# Patient Record
Sex: Female | Born: 1975
Health system: Southern US, Community
[De-identification: ages and names within clinical notes are randomized; demographics above are authoritative.]

## PROBLEM LIST (undated history)

## (undated) DIAGNOSIS — F329 Major depressive disorder, single episode, unspecified: Secondary | ICD-10-CM

## (undated) DIAGNOSIS — E669 Obesity, unspecified: Secondary | ICD-10-CM

## (undated) DIAGNOSIS — F32A Depression, unspecified: Secondary | ICD-10-CM

## (undated) HISTORY — DX: Depression, unspecified: F32.A

## (undated) HISTORY — PX: NASAL SINUS SURGERY: SHX719

## (undated) HISTORY — DX: Major depressive disorder, single episode, unspecified: F32.9

## (undated) HISTORY — DX: Obesity, unspecified: E66.9

---

## 2007-05-10 ENCOUNTER — Ambulatory Visit: Payer: Self-pay | Admitting: Family Medicine

## 2009-12-05 ENCOUNTER — Ambulatory Visit: Payer: Self-pay | Admitting: Otolaryngology

## 2014-01-07 ENCOUNTER — Ambulatory Visit: Payer: Self-pay | Admitting: Obstetrics and Gynecology

## 2014-01-12 ENCOUNTER — Ambulatory Visit: Payer: Self-pay | Admitting: Obstetrics and Gynecology

## 2014-07-30 ENCOUNTER — Ambulatory Visit: Payer: Self-pay | Admitting: Obstetrics and Gynecology

## 2014-09-27 DIAGNOSIS — F32A Depression, unspecified: Secondary | ICD-10-CM | POA: Insufficient documentation

## 2014-09-27 DIAGNOSIS — F329 Major depressive disorder, single episode, unspecified: Secondary | ICD-10-CM

## 2014-09-27 DIAGNOSIS — E669 Obesity, unspecified: Secondary | ICD-10-CM

## 2014-11-12 ENCOUNTER — Ambulatory Visit (INDEPENDENT_AMBULATORY_CARE_PROVIDER_SITE_OTHER): Payer: 59 | Admitting: Obstetrics and Gynecology

## 2014-11-12 ENCOUNTER — Telehealth: Payer: Self-pay | Admitting: Obstetrics and Gynecology

## 2014-11-12 ENCOUNTER — Encounter: Payer: Self-pay | Admitting: Obstetrics and Gynecology

## 2014-11-12 VITALS — BP 111/80 | HR 94 | Ht 66.0 in | Wt 278.7 lb

## 2014-11-12 DIAGNOSIS — F418 Other specified anxiety disorders: Secondary | ICD-10-CM | POA: Diagnosis not present

## 2014-11-12 DIAGNOSIS — Z01419 Encounter for gynecological examination (general) (routine) without abnormal findings: Secondary | ICD-10-CM

## 2014-11-12 DIAGNOSIS — G479 Sleep disorder, unspecified: Secondary | ICD-10-CM | POA: Diagnosis not present

## 2014-11-12 MED ORDER — ALPRAZOLAM 0.25 MG PO TABS
0.5000 mg | ORAL_TABLET | Freq: Every evening | ORAL | Status: DC | PRN
Start: 2014-11-12 — End: 2014-11-16

## 2014-11-12 MED ORDER — ZOLPIDEM TARTRATE ER 12.5 MG PO TBCR: 12.5000 mg | EXTENDED_RELEASE_TABLET | Freq: Every evening | ORAL | Status: DC | PRN

## 2014-11-12 NOTE — Telephone Encounter (Signed)
PT WOULD LIKE A CALL BACK, THEY WRONG RX WAS CALLED IN.

## 2014-11-12 NOTE — Telephone Encounter (Signed)
Pt called and stated [facility] was going to write her Xanax for 0.5mg  due to cost Ok to send in on tuesday

## 2014-11-12 NOTE — Patient Instructions (Signed)
Preventive Care for Adults A healthy lifestyle and preventive care can promote health and wellness. Preventive health guidelines for women include the following key practices.  A routine yearly physical is a good way to check with your health care provider about your health and preventive screening. It is a chance to share any concerns and updates on your health and to receive a thorough exam.  Visit your dentist for a routine exam and preventive care every 6 months. Brush your teeth twice a day and floss once a day. Good oral hygiene prevents tooth decay and gum disease.  The frequency of eye exams is based on your age, health, family medical history, use of contact lenses, and other factors. Follow your health care provider's recommendations for frequency of eye exams.  Eat a healthy diet. Foods like vegetables, fruits, whole grains, low-fat dairy products, and lean protein foods contain the nutrients you need without too many calories. Decrease your intake of foods high in solid fats, added sugars, and salt. Eat the right amount of calories for you.Get information about a proper diet from your health care provider, if necessary.  Regular physical exercise is one of the most important things you can do for your health. Most adults should get at least 150 minutes of moderate-intensity exercise (any activity that increases your heart rate and causes you to sweat) each week. In addition, most adults need muscle-strengthening exercises on 2 or more days a week.  Maintain a healthy weight. The body mass index (BMI) is a screening tool to identify possible weight problems. It provides an estimate of body fat based on height and weight. Your health care provider can find your BMI and can help you achieve or maintain a healthy weight.For adults 20 years and older:  A BMI below 18.5 is considered underweight.  A BMI of 18.5 to 24.9 is normal.  A BMI of 25 to 29.9 is considered overweight.  A BMI of  30 and above is considered obese.  Maintain normal blood lipids and cholesterol levels by exercising and minimizing your intake of saturated fat. Eat a balanced diet with plenty of fruit and vegetables. Blood tests for lipids and cholesterol should begin at age 20 and be repeated every 5 years. If your lipid or cholesterol levels are high, you are over 50, or you are at high risk for heart disease, you may need your cholesterol levels checked more frequently.Ongoing high lipid and cholesterol levels should be treated with medicines if diet and exercise are not working.  If you smoke, find out from your health care provider how to quit. If you do not use tobacco, do not start.  Lung cancer screening is recommended for adults aged 55-80 years who are at high risk for developing lung cancer because of a history of smoking. A yearly low-dose CT scan of the lungs is recommended for people who have at least a 30-pack-year history of smoking and are a current smoker or have quit within the past 15 years. A pack year of smoking is smoking an average of 1 pack of cigarettes a day for 1 year (for example: 1 pack a day for 30 years or 2 packs a day for 15 years). Yearly screening should continue until the smoker has stopped smoking for at least 15 years. Yearly screening should be stopped for people who develop a health problem that would prevent them from having lung cancer treatment.  If you are pregnant, do not drink alcohol. If you are breastfeeding,   be very cautious about drinking alcohol. If you are not pregnant and choose to drink alcohol, do not have more than 1 drink per day. One drink is considered to be 12 ounces (355 mL) of beer, 5 ounces (148 mL) of wine, or 1.5 ounces (44 mL) of liquor.  Avoid use of street drugs. Do not share needles with anyone. Ask for help if you need support or instructions about stopping the use of drugs.  High blood pressure causes heart disease and increases the risk of  stroke. Your blood pressure should be checked at least every 1 to 2 years. Ongoing high blood pressure should be treated with medicines if weight loss and exercise do not work.  If you are 75-52 years old, ask your health care provider if you should take aspirin to prevent strokes.  Diabetes screening involves taking a blood sample to check your fasting blood sugar level. This should be done once every 3 years, after age 15, if you are within normal weight and without risk factors for diabetes. Testing should be considered at a younger age or be carried out more frequently if you are overweight and have at least 1 risk factor for diabetes.  Breast cancer screening is essential preventive care for women. You should practice "breast self-awareness." This means understanding the normal appearance and feel of your breasts and may include breast self-examination. Any changes detected, no matter how small, should be reported to a health care provider. Women in their 58s and 30s should have a clinical breast exam (CBE) by a health care provider as part of a regular health exam every 1 to 3 years. After age 16, women should have a CBE every year. Starting at age 53, women should consider having a mammogram (breast X-ray test) every year. Women who have a family history of breast cancer should talk to their health care provider about genetic screening. Women at a high risk of breast cancer should talk to their health care providers about having an MRI and a mammogram every year.  Breast cancer gene (BRCA)-related cancer risk assessment is recommended for women who have family members with BRCA-related cancers. BRCA-related cancers include breast, ovarian, tubal, and peritoneal cancers. Having family members with these cancers may be associated with an increased risk for harmful changes (mutations) in the breast cancer genes BRCA1 and BRCA2. Results of the assessment will determine the need for genetic counseling and  BRCA1 and BRCA2 testing.  Routine pelvic exams to screen for cancer are no longer recommended for nonpregnant women who are considered low risk for cancer of the pelvic organs (ovaries, uterus, and vagina) and who do not have symptoms. Ask your health care provider if a screening pelvic exam is right for you.  If you have had past treatment for cervical cancer or a condition that could lead to cancer, you need Pap tests and screening for cancer for at least 20 years after your treatment. If Pap tests have been discontinued, your risk factors (such as having a new sexual partner) need to be reassessed to determine if screening should be resumed. Some women have medical problems that increase the chance of getting cervical cancer. In these cases, your health care provider may recommend more frequent screening and Pap tests.  The HPV test is an additional test that may be used for cervical cancer screening. The HPV test looks for the virus that can cause the cell changes on the cervix. The cells collected during the Pap test can be  tested for HPV. The HPV test could be used to screen women aged 30 years and older, and should be used in women of any age who have unclear Pap test results. After the age of 30, women should have HPV testing at the same frequency as a Pap test.  Colorectal cancer can be detected and often prevented. Most routine colorectal cancer screening begins at the age of 50 years and continues through age 75 years. However, your health care provider may recommend screening at an earlier age if you have risk factors for colon cancer. On a yearly basis, your health care provider may provide home test kits to check for hidden blood in the stool. Use of a small camera at the end of a tube, to directly examine the colon (sigmoidoscopy or colonoscopy), can detect the earliest forms of colorectal cancer. Talk to your health care provider about this at age 50, when routine screening begins. Direct  exam of the colon should be repeated every 5-10 years through age 75 years, unless early forms of pre-cancerous polyps or small growths are found.  People who are at an increased risk for hepatitis B should be screened for this virus. You are considered at high risk for hepatitis B if:  You were born in a country where hepatitis B occurs often. Talk with your health care provider about which countries are considered high risk.  Your parents were born in a high-risk country and you have not received a shot to protect against hepatitis B (hepatitis B vaccine).  You have HIV or AIDS.  You use needles to inject street drugs.  You live with, or have sex with, someone who has hepatitis B.  You get hemodialysis treatment.  You take certain medicines for conditions like cancer, organ transplantation, and autoimmune conditions.  Hepatitis C blood testing is recommended for all people born from 1945 through 1965 and any individual with known risks for hepatitis C.  Practice safe sex. Use condoms and avoid high-risk sexual practices to reduce the spread of sexually transmitted infections (STIs). STIs include gonorrhea, chlamydia, syphilis, trichomonas, herpes, HPV, and human immunodeficiency virus (HIV). Herpes, HIV, and HPV are viral illnesses that have no cure. They can result in disability, cancer, and death.  You should be screened for sexually transmitted illnesses (STIs) including gonorrhea and chlamydia if:  You are sexually active and are younger than 24 years.  You are older than 24 years and your health care provider tells you that you are at risk for this type of infection.  Your sexual activity has changed since you were last screened and you are at an increased risk for chlamydia or gonorrhea. Ask your health care provider if you are at risk.  If you are at risk of being infected with HIV, it is recommended that you take a prescription medicine daily to prevent HIV infection. This is  called preexposure prophylaxis (PrEP). You are considered at risk if:  You are a heterosexual woman, are sexually active, and are at increased risk for HIV infection.  You take drugs by injection.  You are sexually active with a partner who has HIV.  Talk with your health care provider about whether you are at high risk of being infected with HIV. If you choose to begin PrEP, you should first be tested for HIV. You should then be tested every 3 months for as long as you are taking PrEP.  Osteoporosis is a disease in which the bones lose minerals and strength   with aging. This can result in serious bone fractures or breaks. The risk of osteoporosis can be identified using a bone density scan. Women ages 65 years and over and women at risk for fractures or osteoporosis should discuss screening with their health care providers. Ask your health care provider whether you should take a calcium supplement or vitamin D to reduce the rate of osteoporosis.  Menopause can be associated with physical symptoms and risks. Hormone replacement therapy is available to decrease symptoms and risks. You should talk to your health care provider about whether hormone replacement therapy is right for you.  Use sunscreen. Apply sunscreen liberally and repeatedly throughout the day. You should seek shade when your shadow is shorter than you. Protect yourself by wearing long sleeves, pants, a wide-brimmed hat, and sunglasses year round, whenever you are outdoors.  Once a month, do a whole body skin exam, using a mirror to look at the skin on your back. Tell your health care provider of new moles, moles that have irregular borders, moles that are larger than a pencil eraser, or moles that have changed in shape or color.  Stay current with required vaccines (immunizations).  Influenza vaccine. All adults should be immunized every year.  Tetanus, diphtheria, and acellular pertussis (Td, Tdap) vaccine. Pregnant women should  receive 1 dose of Tdap vaccine during each pregnancy. The dose should be obtained regardless of the length of time since the last dose. Immunization is preferred during the 27th-36th week of gestation. An adult who has not previously received Tdap or who does not know her vaccine status should receive 1 dose of Tdap. This initial dose should be followed by tetanus and diphtheria toxoids (Td) booster doses every 10 years. Adults with an unknown or incomplete history of completing a 3-dose immunization series with Td-containing vaccines should begin or complete a primary immunization series including a Tdap dose. Adults should receive a Td booster every 10 years.  Varicella vaccine. An adult without evidence of immunity to varicella should receive 2 doses or a second dose if she has previously received 1 dose. Pregnant females who do not have evidence of immunity should receive the first dose after pregnancy. This first dose should be obtained before leaving the health care facility. The second dose should be obtained 4-8 weeks after the first dose.  Human papillomavirus (HPV) vaccine. Females aged 13-26 years who have not received the vaccine previously should obtain the 3-dose series. The vaccine is not recommended for use in pregnant females. However, pregnancy testing is not needed before receiving a dose. If a female is found to be pregnant after receiving a dose, no treatment is needed. In that case, the remaining doses should be delayed until after the pregnancy. Immunization is recommended for any person with an immunocompromised condition through the age of 26 years if she did not get any or all doses earlier. During the 3-dose series, the second dose should be obtained 4-8 weeks after the first dose. The third dose should be obtained 24 weeks after the first dose and 16 weeks after the second dose.  Zoster vaccine. One dose is recommended for adults aged 60 years or older unless certain conditions are  present.  Measles, mumps, and rubella (MMR) vaccine. Adults born before 1957 generally are considered immune to measles and mumps. Adults born in 1957 or later should have 1 or more doses of MMR vaccine unless there is a contraindication to the vaccine or there is laboratory evidence of immunity to   each of the three diseases. A routine second dose of MMR vaccine should be obtained at least 28 days after the first dose for students attending postsecondary schools, health care workers, or international travelers. People who received inactivated measles vaccine or an unknown type of measles vaccine during 1963-1967 should receive 2 doses of MMR vaccine. People who received inactivated mumps vaccine or an unknown type of mumps vaccine before 1979 and are at high risk for mumps infection should consider immunization with 2 doses of MMR vaccine. For females of childbearing age, rubella immunity should be determined. If there is no evidence of immunity, females who are not pregnant should be vaccinated. If there is no evidence of immunity, females who are pregnant should delay immunization until after pregnancy. Unvaccinated health care workers born before 1957 who lack laboratory evidence of measles, mumps, or rubella immunity or laboratory confirmation of disease should consider measles and mumps immunization with 2 doses of MMR vaccine or rubella immunization with 1 dose of MMR vaccine.  Pneumococcal 13-valent conjugate (PCV13) vaccine. When indicated, a person who is uncertain of her immunization history and has no record of immunization should receive the PCV13 vaccine. An adult aged 19 years or older who has certain medical conditions and has not been previously immunized should receive 1 dose of PCV13 vaccine. This PCV13 should be followed with a dose of pneumococcal polysaccharide (PPSV23) vaccine. The PPSV23 vaccine dose should be obtained at least 8 weeks after the dose of PCV13 vaccine. An adult aged 19  years or older who has certain medical conditions and previously received 1 or more doses of PPSV23 vaccine should receive 1 dose of PCV13. The PCV13 vaccine dose should be obtained 1 or more years after the last PPSV23 vaccine dose.  Pneumococcal polysaccharide (PPSV23) vaccine. When PCV13 is also indicated, PCV13 should be obtained first. All adults aged 65 years and older should be immunized. An adult younger than age 65 years who has certain medical conditions should be immunized. Any person who resides in a nursing home or long-term care facility should be immunized. An adult smoker should be immunized. People with an immunocompromised condition and certain other conditions should receive both PCV13 and PPSV23 vaccines. People with human immunodeficiency virus (HIV) infection should be immunized as soon as possible after diagnosis. Immunization during chemotherapy or radiation therapy should be avoided. Routine use of PPSV23 vaccine is not recommended for American Indians, [place] Natives, or people younger than 65 years unless there are medical conditions that require PPSV23 vaccine. When indicated, people who have unknown immunization and have no record of immunization should receive PPSV23 vaccine. One-time revaccination 5 years after the first dose of PPSV23 is recommended for people aged 19-64 years who have chronic kidney failure, nephrotic syndrome, asplenia, or immunocompromised conditions. People who received 1-2 doses of PPSV23 before age 65 years should receive another dose of PPSV23 vaccine at age 65 years or later if at least 5 years have passed since the previous dose. Doses of PPSV23 are not needed for people immunized with PPSV23 at or after age 65 years.  Meningococcal vaccine. Adults with asplenia or persistent complement component deficiencies should receive 2 doses of quadrivalent meningococcal conjugate (MenACWY-D) vaccine. The doses should be obtained at least 2 months apart.  Microbiologists working with certain meningococcal bacteria, military recruits, people at risk during an outbreak, and people who travel to or live in countries with a high rate of meningitis should be immunized. A first-year college student up through age   21 years who is living in a residence hall should receive a dose if she did not receive a dose on or after her 16th birthday. Adults who have certain high-risk conditions should receive one or more doses of vaccine.  Hepatitis A vaccine. Adults who wish to be protected from this disease, have certain high-risk conditions, work with hepatitis A-infected animals, work in hepatitis A research labs, or travel to or work in countries with a high rate of hepatitis A should be immunized. Adults who were previously unvaccinated and who anticipate close contact with an international adoptee during the first 60 days after arrival in the [place] from a country with a high rate of hepatitis A should be immunized.  Hepatitis B vaccine. Adults who wish to be protected from this disease, have certain high-risk conditions, may be exposed to blood or other infectious body fluids, are household contacts or sex partners of hepatitis B positive people, are clients or workers in certain care facilities, or travel to or work in countries with a high rate of hepatitis B should be immunized.  Haemophilus influenzae type b (Hib) vaccine. A previously unvaccinated person with asplenia or sickle cell disease or having a scheduled splenectomy should receive 1 dose of Hib vaccine. Regardless of previous immunization, a recipient of a hematopoietic stem cell transplant should receive a 3-dose series 6-12 months after her successful transplant. Hib vaccine is not recommended for adults with HIV infection. Preventive Services / Frequency Ages 64 to 68 years  Blood pressure check.** / Every 1 to 2 years.  Lipid and cholesterol check.** / Every 5 years beginning at age  22.  Clinical breast exam.** / Every 3 years for women in their 88s and 53s.  BRCA-related cancer risk assessment.** / For women who have family members with a BRCA-related cancer (breast, ovarian, tubal, or peritoneal cancers).  Pap test.** / Every 2 years from ages 90 through 51. Every 3 years starting at age 21 through age 56 or 3 with a history of 3 consecutive normal Pap tests.  HPV screening.** / Every 3 years from ages 24 through ages 1 to 46 with a history of 3 consecutive normal Pap tests.  Hepatitis C blood test.** / For any individual with known risks for hepatitis C.  Skin self-exam. / Monthly.  Influenza vaccine. / Every year.  Tetanus, diphtheria, and acellular pertussis (Tdap, Td) vaccine.** / Consult your health care provider. Pregnant women should receive 1 dose of Tdap vaccine during each pregnancy. 1 dose of Td every 10 years.  Varicella vaccine.** / Consult your health care provider. Pregnant females who do not have evidence of immunity should receive the first dose after pregnancy.  HPV vaccine. / 3 doses over 6 months, if 72 and younger. The vaccine is not recommended for use in pregnant females. However, pregnancy testing is not needed before receiving a dose.  Measles, mumps, rubella (MMR) vaccine.** / You need at least 1 dose of MMR if you were born in 1957 or later. You may also need a 2nd dose. For females of childbearing age, rubella immunity should be determined. If there is no evidence of immunity, females who are not pregnant should be vaccinated. If there is no evidence of immunity, females who are pregnant should delay immunization until after pregnancy.  Pneumococcal 13-valent conjugate (PCV13) vaccine.** / Consult your health care provider.  Pneumococcal polysaccharide (PPSV23) vaccine.** / 1 to 2 doses if you smoke cigarettes or if you have certain conditions.  Meningococcal vaccine.** /  1 dose if you are age 19 to 21 years and a first-year college  student living in a residence hall, or have one of several medical conditions, you need to get vaccinated against meningococcal disease. You may also need additional booster doses.  Hepatitis A vaccine.** / Consult your health care provider.  Hepatitis B vaccine.** / Consult your health care provider.  Haemophilus influenzae type b (Hib) vaccine.** / Consult your health care provider. Ages 40 to 64 years  Blood pressure check.** / Every 1 to 2 years.  Lipid and cholesterol check.** / Every 5 years beginning at age 20 years.  Lung cancer screening. / Every year if you are aged 55-80 years and have a 30-pack-year history of smoking and currently smoke or have quit within the past 15 years. Yearly screening is stopped once you have quit smoking for at least 15 years or develop a health problem that would prevent you from having lung cancer treatment.  Clinical breast exam.** / Every year after age 40 years.  BRCA-related cancer risk assessment.** / For women who have family members with a BRCA-related cancer (breast, ovarian, tubal, or peritoneal cancers).  Mammogram.** / Every year beginning at age 40 years and continuing for as long as you are in good health. Consult with your health care provider.  Pap test.** / Every 3 years starting at age 30 years through age 65 or 70 years with a history of 3 consecutive normal Pap tests.  HPV screening.** / Every 3 years from ages 30 years through ages 65 to 70 years with a history of 3 consecutive normal Pap tests.  Fecal occult blood test (FOBT) of stool. / Every year beginning at age 50 years and continuing until age 75 years. You may not need to do this test if you get a colonoscopy every 10 years.  Flexible sigmoidoscopy or colonoscopy.** / Every 5 years for a flexible sigmoidoscopy or every 10 years for a colonoscopy beginning at age 50 years and continuing until age 75 years.  Hepatitis C blood test.** / For all people born from 1945 through  1965 and any individual with known risks for hepatitis C.  Skin self-exam. / Monthly.  Influenza vaccine. / Every year.  Tetanus, diphtheria, and acellular pertussis (Tdap/Td) vaccine.** / Consult your health care provider. Pregnant women should receive 1 dose of Tdap vaccine during each pregnancy. 1 dose of Td every 10 years.  Varicella vaccine.** / Consult your health care provider. Pregnant females who do not have evidence of immunity should receive the first dose after pregnancy.  Zoster vaccine.** / 1 dose for adults aged 60 years or older.  Measles, mumps, rubella (MMR) vaccine.** / You need at least 1 dose of MMR if you were born in 1957 or later. You may also need a 2nd dose. For females of childbearing age, rubella immunity should be determined. If there is no evidence of immunity, females who are not pregnant should be vaccinated. If there is no evidence of immunity, females who are pregnant should delay immunization until after pregnancy.  Pneumococcal 13-valent conjugate (PCV13) vaccine.** / Consult your health care provider.  Pneumococcal polysaccharide (PPSV23) vaccine.** / 1 to 2 doses if you smoke cigarettes or if you have certain conditions.  Meningococcal vaccine.** / Consult your health care provider.  Hepatitis A vaccine.** / Consult your health care provider.  Hepatitis B vaccine.** / Consult your health care provider.  Haemophilus influenzae type b (Hib) vaccine.** / Consult your health care provider. Ages 65   years and over  Blood pressure check.** / Every 1 to 2 years.  Lipid and cholesterol check.** / Every 5 years beginning at age 22 years.  Lung cancer screening. / Every year if you are aged 73-80 years and have a 30-pack-year history of smoking and currently smoke or have quit within the past 15 years. Yearly screening is stopped once you have quit smoking for at least 15 years or develop a health problem that would prevent you from having lung cancer  treatment.  Clinical breast exam.** / Every year after age 4 years.  BRCA-related cancer risk assessment.** / For women who have family members with a BRCA-related cancer (breast, ovarian, tubal, or peritoneal cancers).  Mammogram.** / Every year beginning at age 40 years and continuing for as long as you are in good health. Consult with your health care provider.  Pap test.** / Every 3 years starting at age 9 years through age 34 or 91 years with 3 consecutive normal Pap tests. Testing can be stopped between 65 and 70 years with 3 consecutive normal Pap tests and no abnormal Pap or HPV tests in the past 10 years.  HPV screening.** / Every 3 years from ages 57 years through ages 64 or 45 years with a history of 3 consecutive normal Pap tests. Testing can be stopped between 65 and 70 years with 3 consecutive normal Pap tests and no abnormal Pap or HPV tests in the past 10 years.  Fecal occult blood test (FOBT) of stool. / Every year beginning at age 15 years and continuing until age 17 years. You may not need to do this test if you get a colonoscopy every 10 years.  Flexible sigmoidoscopy or colonoscopy.** / Every 5 years for a flexible sigmoidoscopy or every 10 years for a colonoscopy beginning at age 86 years and continuing until age 71 years.  Hepatitis C blood test.** / For all people born from [birth year] through 1966 and any individual with known risks for hepatitis C.  Osteoporosis screening.** / A one-time screening for women ages 83 years and over and women at risk for fractures or osteoporosis.  Skin self-exam. / Monthly.  Influenza vaccine. / Every year.  Tetanus, diphtheria, and acellular pertussis (Tdap/Td) vaccine.** / 1 dose of Td every 10 years.  Varicella vaccine.** / Consult your health care provider.  Zoster vaccine.** / 1 dose for adults aged 61 years or older.  Pneumococcal 13-valent conjugate (PCV13) vaccine.** / Consult your health care provider.  Pneumococcal  polysaccharide (PPSV23) vaccine.** / 1 dose for all adults aged 28 years and older.  Meningococcal vaccine.** / Consult your health care provider.  Hepatitis A vaccine.** / Consult your health care provider.  Hepatitis B vaccine.** / Consult your health care provider.  Haemophilus influenzae type b (Hib) vaccine.** / Consult your health care provider. ** Family history and personal history of risk and conditions may change your health care provider's recommendations. Document Released: 06/19/2001 Document Revised: 09/07/2013 Document Reviewed: 09/18/2010 [facility] Patient Information 2015 [place], [place]. This information is not intended to replace advice given to you by your health care provider. Make sure you discuss any questions you have with your health care provider.

## 2014-11-12 NOTE — Progress Notes (Signed)
  Subjective:     Denise Gaines is a 39 y.o. female and is here for a comprehensive physical exam. The patient reports no problems.  History   Social History  . Marital Status: Married    Spouse Name: N/A  . Number of Children: N/A  . Years of Education: N/A   Occupational History  . Not on file.   Social History Main Topics  . Smoking status: Never Smoker   . Smokeless tobacco: Never Used  . Alcohol Use: No  . Drug Use: No  . Sexual Activity: Yes    Birth Control/ Protection: IUD   Other Topics Concern  . Not on file   Social History Narrative   Health Maintenance  Topic Date Due  . HIV Screening  06/12/1989  . PAP SMEAR  06/12/1992  . TETANUS/TDAP  06/12/1993  . INFLUENZA VACCINE  12/06/2014    The following portions of the patient's history were reviewed and updated as appropriate: allergies, current medications, past family history, past medical history, past social history, past surgical history and problem list.  Review of Systems A comprehensive review of systems was negative.   Objective:    General appearance: alert, cooperative, appears stated age and morbidly obese Neck: no adenopathy, no carotid bruit, no JVD, supple, symmetrical, trachea midline and thyroid not enlarged, symmetric, no tenderness/mass/nodules Lungs: clear to auscultation bilaterally Breasts: normal appearance, no masses or tenderness Heart: regular rate and rhythm, S1, S2 normal, no murmur, click, rub or gallop Abdomen: soft, non-tender; bowel sounds normal; no masses,  no organomegaly Pelvic: cervix normal in appearance, external genitalia normal, no adnexal masses or tenderness, no cervical motion tenderness, rectovaginal septum normal, uterus normal size, shape, and consistency and vagina normal without discharge Extremities: extremities normal, atraumatic, no cyanosis or edema    Assessment:    Healthy female exam. Morbid obesity; sleep disturbance; anxiety      Plan:  Routine  MMG due in Sept., routine screening labs; refill ambien and xanax   See After Visit Summary for Counseling Recommendations

## 2014-11-13 LAB — LIPID PANEL
CHOL/HDL RATIO: 3 ratio (ref 0.0–4.4)
Cholesterol, Total: 149 mg/dL (ref 100–199)
HDL: 49 mg/dL (ref >39)
LDL Calculated: 78 mg/dL (ref 0–99)
Triglycerides: 112 mg/dL (ref 0–149)
VLDL Cholesterol Cal: 22 mg/dL (ref 5–40)

## 2014-11-13 LAB — COMPREHENSIVE METABOLIC PANEL
A/G RATIO: 1.8 (ref 1.1–2.5)
ALBUMIN: 4 g/dL (ref 3.5–5.5)
ALK PHOS: 104 IU/L (ref 39–117)
ALT: 25 IU/L (ref 0–32)
AST: 22 IU/L (ref 0–40)
BUN / CREAT RATIO: 17 (ref 9–23)
BUN: 11 mg/dL (ref 6–24)
Bilirubin Total: 0.5 mg/dL (ref 0.0–1.2)
CHLORIDE: 102 mmol/L (ref 97–108)
CO2: 17 mmol/L — AB (ref 18–29)
CREATININE: 0.66 mg/dL (ref 0.57–1.00)
Calcium: 8.9 mg/dL (ref 8.7–10.2)
GFR calc Af Amer: 128 mL/min/{1.73_m2} (ref >59)
GFR calc non Af Amer: 111 mL/min/{1.73_m2} (ref >59)
GLUCOSE: 91 mg/dL (ref 65–99)
Globulin, Total: 2.2 g/dL (ref 1.5–4.5)
Potassium: 4 mmol/L (ref 3.5–5.2)
Sodium: 138 mmol/L (ref 134–144)
TOTAL PROTEIN: 6.2 g/dL (ref 6.0–8.5)

## 2014-11-13 LAB — CBC
HEMATOCRIT: 38.9 % (ref 34.0–46.6)
Hemoglobin: 13.3 g/dL (ref 11.1–15.9)
MCH: 29.2 pg (ref 26.6–33.0)
MCHC: 34.2 g/dL (ref 31.5–35.7)
MCV: 85 fL (ref 79–97)
Platelets: 308 10*3/uL (ref 150–379)
RBC: 4.56 x10E6/uL (ref 3.77–5.28)
RDW: 13.9 % (ref 12.3–15.4)
WBC: 11.1 10*3/uL — AB (ref 3.4–10.8)

## 2014-11-13 LAB — VITAMIN D 25 HYDROXY (VIT D DEFICIENCY, FRACTURES): VIT D 25 HYDROXY: 37.5 ng/mL (ref 30.0–100.0)

## 2014-11-13 LAB — TSH: TSH: 1.87 u[IU]/mL (ref 0.450–4.500)

## 2014-11-16 ENCOUNTER — Encounter: Payer: Self-pay | Admitting: *Deleted

## 2014-11-16 ENCOUNTER — Other Ambulatory Visit: Payer: Self-pay | Admitting: Obstetrics and Gynecology

## 2014-11-16 MED ORDER — ALPRAZOLAM 0.5 MG PO TABS: 0.5000 mg | ORAL_TABLET | Freq: Two times a day (BID) | ORAL | Status: DC | PRN

## 2014-11-16 NOTE — Telephone Encounter (Signed)
Please let her know I printed a new rx today. She can come pick it up or we can mail it

## 2014-11-16 NOTE — Telephone Encounter (Signed)
Notified pt. 

## 2015-01-27 ENCOUNTER — Telehealth: Payer: Self-pay | Admitting: Obstetrics and Gynecology

## 2015-01-27 NOTE — Telephone Encounter (Signed)
[facility] said they need a yearly diagnostic f/u exam. Calcification rt breast 6 oclock  And for both breast

## 2015-01-28 ENCOUNTER — Other Ambulatory Visit: Payer: Self-pay | Admitting: Obstetrics and Gynecology

## 2015-01-28 DIAGNOSIS — R921 Mammographic calcification found on diagnostic imaging of breast: Secondary | ICD-10-CM

## 2015-01-28 NOTE — Telephone Encounter (Signed)
Please let her know orders are in and she can schedule appt

## 2015-02-17 ENCOUNTER — Telehealth: Payer: Self-pay | Admitting: Obstetrics and Gynecology

## 2015-02-17 NOTE — Telephone Encounter (Signed)
Pt said she called 2 wks ago b/c [facility] needs an [place] order in addition to the diagnostic you put in. Please let me know when you put in so i can call her.

## 2015-02-18 ENCOUNTER — Other Ambulatory Visit: Payer: Self-pay | Admitting: Obstetrics and Gynecology

## 2015-02-18 DIAGNOSIS — R928 Other abnormal and inconclusive findings on diagnostic imaging of breast: Secondary | ICD-10-CM

## 2015-02-18 NOTE — Telephone Encounter (Signed)
Orders put in

## 2015-02-22 NOTE — Telephone Encounter (Signed)
THIS IS THE MESSAGE YOU SENT TO [facility]

## 2015-03-18 ENCOUNTER — Ambulatory Visit
Admission: RE | Admit: 2015-03-18 | Discharge: 2015-03-18 | Disposition: A | Discharge status: Home / Self Care | Payer: 59 | Source: Ambulatory Visit | Attending: Obstetrics and Gynecology | Admitting: Obstetrics and Gynecology

## 2015-03-18 ENCOUNTER — Other Ambulatory Visit: Payer: Self-pay | Admitting: Obstetrics and Gynecology

## 2015-03-18 DIAGNOSIS — R928 Other abnormal and inconclusive findings on diagnostic imaging of breast: Secondary | ICD-10-CM | POA: Diagnosis present

## 2015-03-18 DIAGNOSIS — R921 Mammographic calcification found on diagnostic imaging of breast: Secondary | ICD-10-CM

## 2015-03-18 DIAGNOSIS — N6001 Solitary cyst of right breast: Secondary | ICD-10-CM | POA: Insufficient documentation

## 2015-04-21 ENCOUNTER — Ambulatory Visit: Payer: 59 | Admitting: Physician Assistant

## 2015-04-26 ENCOUNTER — Other Ambulatory Visit: Payer: Self-pay | Admitting: Obstetrics and Gynecology

## 2015-04-27 ENCOUNTER — Other Ambulatory Visit: Payer: Self-pay | Admitting: Obstetrics and Gynecology

## 2015-04-27 NOTE — Telephone Encounter (Signed)
pls see pt would like refill on her [place]

## 2015-05-08 HISTORY — PX: BREAST BIOPSY: SHX20

## 2015-06-22 ENCOUNTER — Other Ambulatory Visit: Payer: Self-pay | Admitting: Obstetrics and Gynecology

## 2015-06-22 MED ORDER — CITALOPRAM HYDROBROMIDE 20 MG PO TABS: ORAL_TABLET | ORAL | Status: DC

## 2015-06-22 NOTE — Telephone Encounter (Signed)
Med refill for citalopram approved, pt has been on medication for long period of time

## 2015-06-22 NOTE — Addendum Note (Signed)
Addended by: Versie Starks on: 06/22/2015 10:43 AM   Modules accepted: Orders

## 2015-07-18 ENCOUNTER — Other Ambulatory Visit: Payer: Self-pay | Admitting: Obstetrics and Gynecology

## 2015-07-18 NOTE — Telephone Encounter (Signed)
Hey please see refill request

## 2015-09-13 ENCOUNTER — Ambulatory Visit: Payer: Self-pay | Admitting: Physician Assistant

## 2015-09-13 ENCOUNTER — Encounter: Payer: Self-pay | Admitting: Physician Assistant

## 2015-09-13 VITALS — BP 100/62 | HR 76 | Temp 98.8°F

## 2015-09-13 DIAGNOSIS — M722 Plantar fascial fibromatosis: Secondary | ICD-10-CM

## 2015-09-13 MED ORDER — ETODOLAC 500 MG PO TABS: 500.0000 mg | ORAL_TABLET | Freq: Two times a day (BID) | ORAL | Status: DC

## 2015-09-13 NOTE — Progress Notes (Signed)
S: c/o r foot pain, hx of plantar fasciitis, started running and working out but was wearing older shoes and did not use her orthotics, pain started afterwards, also having some skin chaffing between thighs when she gets sweaty, no drainage from areas just wants advice on what to do  O: vitals wnl, nad, r foot tender along plantar tendon, full rom, n/v intact, skin intact  A: plantar fasciitis  P: lodine 500mg  bid, corn starch powder daily to prevent chaffing

## 2015-09-19 ENCOUNTER — Telehealth: Payer: Self-pay | Admitting: Obstetrics and Gynecology

## 2015-09-19 NOTE — Telephone Encounter (Signed)
Ms. Watt called saying orders need to be sent to the location where she has her 3d/4d Ultra [facility] performed. She said she has a cyst and she has the scan every 6 months. She'd like a phone call when the order has been sent.   Pt's ph# [phone] Thank you.

## 2015-09-26 ENCOUNTER — Other Ambulatory Visit: Payer: Self-pay | Admitting: Obstetrics and Gynecology

## 2015-09-26 DIAGNOSIS — R928 Other abnormal and inconclusive findings on diagnostic imaging of breast: Secondary | ICD-10-CM

## 2015-09-26 NOTE — Telephone Encounter (Signed)
Please let her know the order is in

## 2015-09-27 ENCOUNTER — Telehealth: Payer: Self-pay | Admitting: Obstetrics and Gynecology

## 2015-09-27 ENCOUNTER — Telehealth: Payer: Self-pay

## 2015-09-27 DIAGNOSIS — N6310 Unspecified lump in the right breast, unspecified quadrant: Secondary | ICD-10-CM

## 2015-09-27 DIAGNOSIS — N63 Unspecified lump in unspecified breast: Secondary | ICD-10-CM

## 2015-09-27 NOTE — Telephone Encounter (Signed)
Patient needs a correct order put in for a mammogram. She stated she needs a diagnostic mammo for the right breast. She is [facility] patient.Thanks

## 2015-09-27 NOTE — Telephone Encounter (Signed)
Pt aware per [facility] needed rt breast dx only not bilateral.

## 2015-09-27 NOTE — Telephone Encounter (Signed)
Pt aware dx mammo ordered. 

## 2015-09-27 NOTE — Telephone Encounter (Signed)
Patient notified

## 2015-10-04 ENCOUNTER — Other Ambulatory Visit: Payer: Self-pay | Admitting: Obstetrics and Gynecology

## 2015-10-04 NOTE — Telephone Encounter (Signed)
See message for refill 

## 2015-10-06 ENCOUNTER — Ambulatory Visit
Admission: RE | Admit: 2015-10-06 | Discharge: 2015-10-06 | Disposition: A | Discharge status: Home / Self Care | Payer: 59 | Source: Ambulatory Visit | Attending: Obstetrics and Gynecology | Admitting: Obstetrics and Gynecology

## 2015-10-06 DIAGNOSIS — N63 Unspecified lump in unspecified breast: Secondary | ICD-10-CM

## 2015-10-06 DIAGNOSIS — R928 Other abnormal and inconclusive findings on diagnostic imaging of breast: Secondary | ICD-10-CM

## 2015-10-10 ENCOUNTER — Telehealth: Payer: Self-pay | Admitting: *Deleted

## 2015-10-10 ENCOUNTER — Other Ambulatory Visit: Payer: Self-pay | Admitting: Obstetrics and Gynecology

## 2015-10-10 DIAGNOSIS — N63 Unspecified lump in unspecified breast: Secondary | ICD-10-CM

## 2015-10-10 NOTE — Telephone Encounter (Signed)
Patient called and states she is having a breast biopsy on Monday and she is a little anxious about the procedure. Patient is wondering if [facility] can prescribe her something for her nerves. Patient is requesting a call back. Call is [phone].

## 2015-10-11 ENCOUNTER — Encounter: Payer: Self-pay | Admitting: Physician Assistant

## 2015-10-11 ENCOUNTER — Other Ambulatory Visit: Payer: Self-pay | Admitting: Obstetrics and Gynecology

## 2015-10-11 ENCOUNTER — Ambulatory Visit: Payer: Self-pay | Admitting: Physician Assistant

## 2015-10-11 VITALS — BP 110/80 | HR 76 | Temp 98.3°F

## 2015-10-11 DIAGNOSIS — J020 Streptococcal pharyngitis: Secondary | ICD-10-CM

## 2015-10-11 DIAGNOSIS — H6982 Other specified disorders of Eustachian tube, left ear: Secondary | ICD-10-CM

## 2015-10-11 LAB — POCT RAPID STREP A (OFFICE): Rapid Strep A Screen: POSITIVE — AB

## 2015-10-11 MED ORDER — FLUCONAZOLE 150 MG PO TABS: 150.0000 mg | ORAL_TABLET | Freq: Once | ORAL | Status: DC

## 2015-10-11 MED ORDER — ALPRAZOLAM 0.5 MG PO TABS: 0.5000 mg | ORAL_TABLET | Freq: Two times a day (BID) | ORAL | Status: DC | PRN

## 2015-10-11 MED ORDER — PREDNISONE 10 MG PO TABS: 30.0000 mg | ORAL_TABLET | Freq: Every day | ORAL | Status: DC

## 2015-10-11 MED ORDER — AZITHROMYCIN 250 MG PO TABS: ORAL_TABLET | ORAL | Status: DC

## 2015-10-11 NOTE — Progress Notes (Signed)
S: c/o sore throat since Friday, no fever/chills, does feel like she got hit by a truck, a lot of left ear pain, no cough or congestion, no sinus drainage, states has bad smell to her breath  O: vitals wnl, nad, tms pink and dull b/l, throat a little red no exudate, neck supple no lymph, lungs c t a, cv rrr, q strep +  A: acute strep throat  P: zpack, pred 30mg  qd x 3d, diflucan if needed

## 2015-10-11 NOTE — Telephone Encounter (Signed)
Pt notified of refill on Xanax, will call to [place] tomorrow for pt.

## 2015-10-11 NOTE — Telephone Encounter (Signed)
Please let her know I sent in a refill on her xanax- as that works well to help anxiety before procedures.

## 2015-10-17 ENCOUNTER — Ambulatory Visit
Admission: RE | Admit: 2015-10-17 | Discharge: 2015-10-17 | Disposition: A | Discharge status: Home / Self Care | Payer: 59 | Source: Ambulatory Visit | Attending: Obstetrics and Gynecology | Admitting: Obstetrics and Gynecology

## 2015-10-17 DIAGNOSIS — N63 Unspecified lump in unspecified breast: Secondary | ICD-10-CM

## 2015-10-18 LAB — SURGICAL PATHOLOGY

## 2016-03-05 ENCOUNTER — Encounter: Payer: Self-pay | Admitting: Obstetrics and Gynecology

## 2016-03-07 ENCOUNTER — Other Ambulatory Visit: Payer: Self-pay | Admitting: Obstetrics and Gynecology

## 2016-03-07 ENCOUNTER — Encounter: Payer: Self-pay | Admitting: Obstetrics and Gynecology

## 2016-03-07 ENCOUNTER — Ambulatory Visit (INDEPENDENT_AMBULATORY_CARE_PROVIDER_SITE_OTHER): Payer: 59 | Admitting: Obstetrics and Gynecology

## 2016-03-07 VITALS — BP 103/76 | HR 84 | Ht 66.0 in | Wt 240.0 lb

## 2016-03-07 DIAGNOSIS — Z01419 Encounter for gynecological examination (general) (routine) without abnormal findings: Secondary | ICD-10-CM | POA: Diagnosis not present

## 2016-03-07 NOTE — Patient Instructions (Addendum)
Thank you for enrolling in [facility]. Please follow the instructions below to securely access your online medical record. MyChart allows you to send messages to your doctor, view your test results, renew your prescriptions, schedule appointments, and more.  How Do I Sign Up? 1. In your Internet browser, go to [email or web address]. 2. Click on the New  User? link in the Sign In box.  3. Enter your MyChart Access Code exactly as it appears below. You will not need to use this code after you have completed the sign-up process. If you do not sign up before the expiration date, you must request a new code. MyChart Access Code: [email or web address] Expires: 05/06/2016  8:20 AM  4. Enter the last four digits of your Social Security Number (xxxx) and Date of Birth (mm/dd/yyyy) as indicated and click Next. You will be taken to the next sign-up page. 5. Create a MyChart ID. This will be your MyChart login ID and cannot be changed, so think of one that is secure and easy to remember. 6. Create a MyChart password. You can change your password at any time. 7. Enter your Password Reset Question and Answer and click Next. This can be used at a later time if you forget your password.  8. Select your communication preference, and if applicable enter your e-mail address. You will receive e-mail notification when new information is available in MyChart by choosing to receive e-mail notifications and filling in your e-mail. 9. Click Sign In. You can now view your medical record.   Additional Information If you have questions, you can email [email or web address] or call [phone] to talk to our [facility] staff. Remember, MyChart is NOT to be used for urgent needs. For medical emergencies, dial 911.  Preventive Care for Adults, Female A healthy lifestyle and preventive care can promote health and wellness. Preventive health guidelines for women include the following key practices.  A routine  yearly physical is a good way to check with your health care provider about your health and preventive screening. It is a chance to share any concerns and updates on your health and to receive a thorough exam.  Visit your dentist for a routine exam and preventive care every 6 months. Brush your teeth twice a day and floss once a day. Good oral hygiene prevents tooth decay and gum disease.  The frequency of eye exams is based on your age, health, family medical history, use of contact lenses, and other factors. Follow your health care provider's recommendations for frequency of eye exams.  Eat a healthy diet. Foods like vegetables, fruits, whole grains, low-fat dairy products, and lean protein foods contain the nutrients you need without too many calories. Decrease your intake of foods high in solid fats, added sugars, and salt. Eat the right amount of calories for you.Get information about a proper diet from your health care provider, if necessary.  Regular physical exercise is one of the most important things you can do for your health. Most adults should get at least 150 minutes of moderate-intensity exercise (any activity that increases your heart rate and causes you to sweat) each week. In addition, most adults need muscle-strengthening exercises on 2 or more days a week.  Maintain a healthy weight. The body mass index (BMI) is a screening tool to identify possible weight problems. It provides an estimate of body fat based on height and weight. Your health care provider can find your BMI and can help you achieve or maintain  a healthy weight.For adults 20 years and older:  A BMI below 18.5 is considered underweight.  A BMI of 18.5 to 24.9 is normal.  A BMI of 25 to 29.9 is considered overweight.  A BMI of 30 and above is considered obese.  Maintain normal blood lipids and cholesterol levels by exercising and minimizing your intake of saturated fat. Eat a balanced diet with plenty of fruit  and vegetables. Blood tests for lipids and cholesterol should begin at age 19 and be repeated every 5 years. If your lipid or cholesterol levels are high, you are over 50, or you are at high risk for heart disease, you may need your cholesterol levels checked more frequently.Ongoing high lipid and cholesterol levels should be treated with medicines if diet and exercise are not working.  If you smoke, find out from your health care provider how to quit. If you do not use tobacco, do not start.  Lung cancer screening is recommended for adults aged 77-80 years who are at high risk for developing lung cancer because of a history of smoking. A yearly low-dose CT scan of the lungs is recommended for people who have at least a 30-pack-year history of smoking and are a current smoker or have quit within the past 15 years. A pack year of smoking is smoking an average of 1 pack of cigarettes a day for 1 year (for example: 1 pack a day for 30 years or 2 packs a day for 15 years). Yearly screening should continue until the smoker has stopped smoking for at least 15 years. Yearly screening should be stopped for people who develop a health problem that would prevent them from having lung cancer treatment.  If you are pregnant, do not drink alcohol. If you are breastfeeding, be very cautious about drinking alcohol. If you are not pregnant and choose to drink alcohol, do not have more than 1 drink per day. One drink is considered to be 12 ounces (355 mL) of beer, 5 ounces (148 mL) of wine, or 1.5 ounces (44 mL) of liquor.  Avoid use of street drugs. Do not share needles with anyone. Ask for help if you need support or instructions about stopping the use of drugs.  High blood pressure causes heart disease and increases the risk of stroke. Your blood pressure should be checked at least every 1 to 2 years. Ongoing high blood pressure should be treated with medicines if weight loss and exercise do not work.  If you are  15-24 years old, ask your health care provider if you should take aspirin to prevent strokes.  Diabetes screening is done by taking a blood sample to check your blood glucose level after you have not eaten for a certain period of time (fasting). If you are not overweight and you do not have risk factors for diabetes, you should be screened once every 3 years starting at age 23. If you are overweight or obese and you are 46-12 years of age, you should be screened for diabetes every year as part of your cardiovascular risk assessment.  Breast cancer screening is essential preventive care for women. You should practice "breast self-awareness." This means understanding the normal appearance and feel of your breasts and may include breast self-examination. Any changes detected, no matter how small, should be reported to a health care provider. Women in their 23s and 30s should have a clinical breast exam (CBE) by a health care provider as part of a regular health exam every  1 to 3 years. After age 81, women should have a CBE every year. Starting at age 39, women should consider having a mammogram (breast X-ray test) every year. Women who have a family history of breast cancer should talk to their health care provider about genetic screening. Women at a high risk of breast cancer should talk to their health care providers about having an MRI and a mammogram every year.  Breast cancer gene (BRCA)-related cancer risk assessment is recommended for women who have family members with BRCA-related cancers. BRCA-related cancers include breast, ovarian, tubal, and peritoneal cancers. Having family members with these cancers may be associated with an increased risk for harmful changes (mutations) in the breast cancer genes BRCA1 and BRCA2. Results of the assessment will determine the need for genetic counseling and BRCA1 and BRCA2 testing.  Your health care provider may recommend that you be screened regularly for cancer  of the pelvic organs (ovaries, uterus, and vagina). This screening involves a pelvic examination, including checking for microscopic changes to the surface of your cervix (Pap test). You may be encouraged to have this screening done every 3 years, beginning at age 82.  For women ages 25-65, health care providers may recommend pelvic exams and Pap testing every 3 years, or they may recommend the Pap and pelvic exam, combined with testing for human papilloma virus (HPV), every 5 years. Some types of HPV increase your risk of cervical cancer. Testing for HPV may also be done on women of any age with unclear Pap test results.  Other health care providers may not recommend any screening for nonpregnant women who are considered low risk for pelvic cancer and who do not have symptoms. Ask your health care provider if a screening pelvic exam is right for you.  If you have had past treatment for cervical cancer or a condition that could lead to cancer, you need Pap tests and screening for cancer for at least 20 years after your treatment. If Pap tests have been discontinued, your risk factors (such as having a new sexual partner) need to be reassessed to determine if screening should resume. Some women have medical problems that increase the chance of getting cervical cancer. In these cases, your health care provider may recommend more frequent screening and Pap tests.  Colorectal cancer can be detected and often prevented. Most routine colorectal cancer screening begins at the age of 81 years and continues through age 22 years. However, your health care provider may recommend screening at an earlier age if you have risk factors for colon cancer. On a yearly basis, your health care provider may provide home test kits to check for hidden blood in the stool. Use of a small camera at the end of a tube, to directly examine the colon (sigmoidoscopy or colonoscopy), can detect the earliest forms of colorectal cancer. Talk  to your health care provider about this at age 48, when routine screening begins. Direct exam of the colon should be repeated every 5-10 years through age 17 years, unless early forms of precancerous polyps or small growths are found.  People who are at an increased risk for hepatitis B should be screened for this virus. You are considered at high risk for hepatitis B if:  You were born in a country where hepatitis B occurs often. Talk with your health care provider about which countries are considered high risk.  Your parents were born in a high-risk country and you have not received a shot to protect  against hepatitis B (hepatitis B vaccine).  You have HIV or AIDS.  You use needles to inject street drugs.  You live with, or have sex with, someone who has hepatitis B.  You get hemodialysis treatment.  You take certain medicines for conditions like cancer, organ transplantation, and autoimmune conditions.  Hepatitis C blood testing is recommended for all people born from [birth year] through 1966 and any individual with known risks for hepatitis C.  Practice safe sex. Use condoms and avoid high-risk sexual practices to reduce the spread of sexually transmitted infections (STIs). STIs include gonorrhea, chlamydia, syphilis, trichomonas, herpes, HPV, and human immunodeficiency virus (HIV). Herpes, HIV, and HPV are viral illnesses that have no cure. They can result in disability, cancer, and death.  You should be screened for sexually transmitted illnesses (STIs) including gonorrhea and chlamydia if:  You are sexually active and are younger than 24 years.  You are older than 24 years and your health care provider tells you that you are at risk for this type of infection.  Your sexual activity has changed since you were last screened and you are at an increased risk for chlamydia or gonorrhea. Ask your health care provider if you are at risk.  If you are at risk of being infected with HIV, it is  recommended that you take a prescription medicine daily to prevent HIV infection. This is called preexposure prophylaxis (PrEP). You are considered at risk if:  You are sexually active and do not regularly use condoms or know the HIV status of your partner(s).  You take drugs by injection.  You are sexually active with a partner who has HIV.  Talk with your health care provider about whether you are at high risk of being infected with HIV. If you choose to begin PrEP, you should first be tested for HIV. You should then be tested every 3 months for as long as you are taking PrEP.  Osteoporosis is a disease in which the bones lose minerals and strength with aging. This can result in serious bone fractures or breaks. The risk of osteoporosis can be identified using a bone density scan. Women ages 73 years and over and women at risk for fractures or osteoporosis should discuss screening with their health care providers. Ask your health care provider whether you should take a calcium supplement or vitamin D to reduce the rate of osteoporosis.  Menopause can be associated with physical symptoms and risks. Hormone replacement therapy is available to decrease symptoms and risks. You should talk to your health care provider about whether hormone replacement therapy is right for you.  Use sunscreen. Apply sunscreen liberally and repeatedly throughout the day. You should seek shade when your shadow is shorter than you. Protect yourself by wearing long sleeves, pants, a wide-brimmed hat, and sunglasses year round, whenever you are outdoors.  Once a month, do a whole body skin exam, using a mirror to look at the skin on your back. Tell your health care provider of new moles, moles that have irregular borders, moles that are larger than a pencil eraser, or moles that have changed in shape or color.  Stay current with required vaccines (immunizations).  Influenza vaccine. All adults should be immunized every  year.  Tetanus, diphtheria, and acellular pertussis (Td, Tdap) vaccine. Pregnant women should receive 1 dose of Tdap vaccine during each pregnancy. The dose should be obtained regardless of the length of time since the last dose. Immunization is preferred during the 27th-36th week  of gestation. An adult who has not previously received Tdap or who does not know her vaccine status should receive 1 dose of Tdap. This initial dose should be followed by tetanus and diphtheria toxoids (Td) booster doses every 10 years. Adults with an unknown or incomplete history of completing a 3-dose immunization series with Td-containing vaccines should begin or complete a primary immunization series including a Tdap dose. Adults should receive a Td booster every 10 years.  Varicella vaccine. An adult without evidence of immunity to varicella should receive 2 doses or a second dose if she has previously received 1 dose. Pregnant females who do not have evidence of immunity should receive the first dose after pregnancy. This first dose should be obtained before leaving the health care facility. The second dose should be obtained 4-8 weeks after the first dose.  Human papillomavirus (HPV) vaccine. Females aged 13-26 years who have not received the vaccine previously should obtain the 3-dose series. The vaccine is not recommended for use in pregnant females. However, pregnancy testing is not needed before receiving a dose. If a female is found to be pregnant after receiving a dose, no treatment is needed. In that case, the remaining doses should be delayed until after the pregnancy. Immunization is recommended for any person with an immunocompromised condition through the age of 39 years if she did not get any or all doses earlier. During the 3-dose series, the second dose should be obtained 4-8 weeks after the first dose. The third dose should be obtained 24 weeks after the first dose and 16 weeks after the second dose.  Zoster  vaccine. One dose is recommended for adults aged 72 years or older unless certain conditions are present.  Measles, mumps, and rubella (MMR) vaccine. Adults born before [birth year] generally are considered immune to measles and mumps. Adults born in [birth year] or later should have 1 or more doses of MMR vaccine unless there is a contraindication to the vaccine or there is laboratory evidence of immunity to each of the three diseases. A routine second dose of MMR vaccine should be obtained at least 28 days after the first dose for students attending postsecondary schools, health care workers, or international travelers. People who received inactivated measles vaccine or an unknown type of measles vaccine during 1963-1967 should receive 2 doses of MMR vaccine. People who received inactivated mumps vaccine or an unknown type of mumps vaccine before 1979 and are at high risk for mumps infection should consider immunization with 2 doses of MMR vaccine. For females of childbearing age, rubella immunity should be determined. If there is no evidence of immunity, females who are not pregnant should be vaccinated. If there is no evidence of immunity, females who are pregnant should delay immunization until after pregnancy. Unvaccinated health care workers born before [birth year] who lack laboratory evidence of measles, mumps, or rubella immunity or laboratory confirmation of disease should consider measles and mumps immunization with 2 doses of MMR vaccine or rubella immunization with 1 dose of MMR vaccine.  Pneumococcal 13-valent conjugate (PCV13) vaccine. When indicated, a person who is uncertain of his immunization history and has no record of immunization should receive the PCV13 vaccine. All adults 68 years of age and older should receive this vaccine. An adult aged 47 years or older who has certain medical conditions and has not been previously immunized should receive 1 dose of PCV13 vaccine. This PCV13 should be followed with a dose  of pneumococcal polysaccharide (PPSV23) vaccine. Adults who are  at high risk for pneumococcal disease should obtain the PPSV23 vaccine at least 8 weeks after the dose of PCV13 vaccine. Adults older than 41 years of age who have normal immune system function should obtain the PPSV23 vaccine dose at least 1 year after the dose of PCV13 vaccine.  Pneumococcal polysaccharide (PPSV23) vaccine. When PCV13 is also indicated, PCV13 should be obtained first. All adults aged 12 years and older should be immunized. An adult younger than age 26 years who has certain medical conditions should be immunized. Any person who resides in a nursing home or long-term care facility should be immunized. An adult smoker should be immunized. People with an immunocompromised condition and certain other conditions should receive both PCV13 and PPSV23 vaccines. People with human immunodeficiency virus (HIV) infection should be immunized as soon as possible after diagnosis. Immunization during chemotherapy or radiation therapy should be avoided. Routine use of PPSV23 vaccine is not recommended for American Indians, [place] Natives, or people younger than 65 years unless there are medical conditions that require PPSV23 vaccine. When indicated, people who have unknown immunization and have no record of immunization should receive PPSV23 vaccine. One-time revaccination 5 years after the first dose of PPSV23 is recommended for people aged 19-64 years who have chronic kidney failure, nephrotic syndrome, asplenia, or immunocompromised conditions. People who received 1-2 doses of PPSV23 before age 66 years should receive another dose of PPSV23 vaccine at age 77 years or later if at least 5 years have passed since the previous dose. Doses of PPSV23 are not needed for people immunized with PPSV23 at or after age 81 years.  Meningococcal vaccine. Adults with asplenia or persistent complement component deficiencies should receive 2 doses of  quadrivalent meningococcal conjugate (MenACWY-D) vaccine. The doses should be obtained at least 2 months apart. Microbiologists working with certain meningococcal bacteria, [employer] recruits, people at risk during an outbreak, and people who travel to or live in countries with a high rate of meningitis should be immunized. A first-year college student up through age 36 years who is living in a residence hall should receive a dose if she did not receive a dose on or after her 16th birthday. Adults who have certain high-risk conditions should receive one or more doses of vaccine.  Hepatitis A vaccine. Adults who wish to be protected from this disease, have certain high-risk conditions, work with hepatitis A-infected animals, work in hepatitis A research labs, or travel to or work in countries with a high rate of hepatitis A should be immunized. Adults who were previously unvaccinated and who anticipate close contact with an international adoptee during the first 60 days after arrival in the [place] from a country with a high rate of hepatitis A should be immunized.  Hepatitis B vaccine. Adults who wish to be protected from this disease, have certain high-risk conditions, may be exposed to blood or other infectious body fluids, are household contacts or sex partners of hepatitis B positive people, are clients or workers in certain care facilities, or travel to or work in countries with a high rate of hepatitis B should be immunized.  Haemophilus influenzae type b (Hib) vaccine. A previously unvaccinated person with asplenia or sickle cell disease or having a scheduled splenectomy should receive 1 dose of Hib vaccine. Regardless of previous immunization, a recipient of a hematopoietic stem cell transplant should receive a 3-dose series 6-12 months after her successful transplant. Hib vaccine is not recommended for adults with HIV infection. Preventive Services / Frequency  Ages 63 to 39 years  Blood  pressure check.** / Every 3-5 years.  Lipid and cholesterol check.** / Every 5 years beginning at age 65.  Clinical breast exam.** / Every 3 years for women in their 11s and 67s.  BRCA-related cancer risk assessment.** / For women who have family members with a BRCA-related cancer (breast, ovarian, tubal, or peritoneal cancers).  Pap test.** / Every 2 years from ages 16 through 62. Every 3 years starting at age 65 through age 35 or 60 with a history of 3 consecutive normal Pap tests.  HPV screening.** / Every 3 years from ages 65 through ages 32 to 2 with a history of 3 consecutive normal Pap tests.  Hepatitis C blood test.** / For any individual with known risks for hepatitis C.  Skin self-exam. / Monthly.  Influenza vaccine. / Every year.  Tetanus, diphtheria, and acellular pertussis (Tdap, Td) vaccine.** / Consult your health care provider. Pregnant women should receive 1 dose of Tdap vaccine during each pregnancy. 1 dose of Td every 10 years.  Varicella vaccine.** / Consult your health care provider. Pregnant females who do not have evidence of immunity should receive the first dose after pregnancy.  HPV vaccine. / 3 doses over 6 months, if 21 and younger. The vaccine is not recommended for use in pregnant females. However, pregnancy testing is not needed before receiving a dose.  Measles, mumps, rubella (MMR) vaccine.** / You need at least 1 dose of MMR if you were born in 1957 or later. You may also need a 2nd dose. For females of childbearing age, rubella immunity should be determined. If there is no evidence of immunity, females who are not pregnant should be vaccinated. If there is no evidence of immunity, females who are pregnant should delay immunization until after pregnancy.  Pneumococcal 13-valent conjugate (PCV13) vaccine.** / Consult your health care provider.  Pneumococcal polysaccharide (PPSV23) vaccine.** / 1 to 2 doses if you smoke cigarettes or if you have certain  conditions.  Meningococcal vaccine.** / 1 dose if you are age 56 to 20 years and a Market researcher living in a residence hall, or have one of several medical conditions, you need to get vaccinated against meningococcal disease. You may also need additional booster doses.  Hepatitis A vaccine.** / Consult your health care provider.  Hepatitis B vaccine.** / Consult your health care provider.  Haemophilus influenzae type b (Hib) vaccine.** / Consult your health care provider. Ages 74 to 44 years  Blood pressure check.** / Every year.  Lipid and cholesterol check.** / Every 5 years beginning at age 65 years.  Lung cancer screening. / Every year if you are aged 45-80 years and have a 30-pack-year history of smoking and currently smoke or have quit within the past 15 years. Yearly screening is stopped once you have quit smoking for at least 15 years or develop a health problem that would prevent you from having lung cancer treatment.  Clinical breast exam.** / Every year after age 63 years.  BRCA-related cancer risk assessment.** / For women who have family members with a BRCA-related cancer (breast, ovarian, tubal, or peritoneal cancers).  Mammogram.** / Every year beginning at age 23 years and continuing for as long as you are in good health. Consult with your health care provider.  Pap test.** / Every 3 years starting at age 28 years through age 40 or 6 years with a history of 3 consecutive normal Pap tests.  HPV screening.** / Every  3 years from ages 14 years through ages 35 to 5 years with a history of 3 consecutive normal Pap tests.  Fecal occult blood test (FOBT) of stool. / Every year beginning at age 29 years and continuing until age 67 years. You may not need to do this test if you get a colonoscopy every 10 years.  Flexible sigmoidoscopy or colonoscopy.** / Every 5 years for a flexible sigmoidoscopy or every 10 years for a colonoscopy beginning at age 54 years and  continuing until age 92 years.  Hepatitis C blood test.** / For all people born from [birth year] through 1966 and any individual with known risks for hepatitis C.  Skin self-exam. / Monthly.  Influenza vaccine. / Every year.  Tetanus, diphtheria, and acellular pertussis (Tdap/Td) vaccine.** / Consult your health care provider. Pregnant women should receive 1 dose of Tdap vaccine during each pregnancy. 1 dose of Td every 10 years.  Varicella vaccine.** / Consult your health care provider. Pregnant females who do not have evidence of immunity should receive the first dose after pregnancy.  Zoster vaccine.** / 1 dose for adults aged 72 years or older.  Measles, mumps, rubella (MMR) vaccine.** / You need at least 1 dose of MMR if you were born in 1957 or later. You may also need a second dose. For females of childbearing age, rubella immunity should be determined. If there is no evidence of immunity, females who are not pregnant should be vaccinated. If there is no evidence of immunity, females who are pregnant should delay immunization until after pregnancy.  Pneumococcal 13-valent conjugate (PCV13) vaccine.** / Consult your health care provider.  Pneumococcal polysaccharide (PPSV23) vaccine.** / 1 to 2 doses if you smoke cigarettes or if you have certain conditions.  Meningococcal vaccine.** / Consult your health care provider.  Hepatitis A vaccine.** / Consult your health care provider.  Hepatitis B vaccine.** / Consult your health care provider.  Haemophilus influenzae type b (Hib) vaccine.** / Consult your health care provider. Ages 45 years and over  Blood pressure check.** / Every year.  Lipid and cholesterol check.** / Every 5 years beginning at age 77 years.  Lung cancer screening. / Every year if you are aged 57-80 years and have a 30-pack-year history of smoking and currently smoke or have quit within the past 15 years. Yearly screening is stopped once you have quit smoking for at  least 15 years or develop a health problem that would prevent you from having lung cancer treatment.  Clinical breast exam.** / Every year after age 68 years.  BRCA-related cancer risk assessment.** / For women who have family members with a BRCA-related cancer (breast, ovarian, tubal, or peritoneal cancers).  Mammogram.** / Every year beginning at age 25 years and continuing for as long as you are in good health. Consult with your health care provider.  Pap test.** / Every 3 years starting at age 79 years through age 75 or 72 years with 3 consecutive normal Pap tests. Testing can be stopped between 65 and 70 years with 3 consecutive normal Pap tests and no abnormal Pap or HPV tests in the past 10 years.  HPV screening.** / Every 3 years from ages 34 years through ages 36 or 59 years with a history of 3 consecutive normal Pap tests. Testing can be stopped between 65 and 70 years with 3 consecutive normal Pap tests and no abnormal Pap or HPV tests in the past 10 years.  Fecal occult blood test (FOBT) of stool. /  Every year beginning at age 4 years and continuing until age 84 years. You may not need to do this test if you get a colonoscopy every 10 years.  Flexible sigmoidoscopy or colonoscopy.** / Every 5 years for a flexible sigmoidoscopy or every 10 years for a colonoscopy beginning at age 58 years and continuing until age 5 years.  Hepatitis C blood test.** / For all people born from [birth year] through 1965 and any individual with known risks for hepatitis C.  Osteoporosis screening.** / A one-time screening for women ages 35 years and over and women at risk for fractures or osteoporosis.  Skin self-exam. / Monthly.  Influenza vaccine. / Every year.  Tetanus, diphtheria, and acellular pertussis (Tdap/Td) vaccine.** / 1 dose of Td every 10 years.  Varicella vaccine.** / Consult your health care provider.  Zoster vaccine.** / 1 dose for adults aged 20 years or older.  Pneumococcal  13-valent conjugate (PCV13) vaccine.** / Consult your health care provider.  Pneumococcal polysaccharide (PPSV23) vaccine.** / 1 dose for all adults aged 85 years and older.  Meningococcal vaccine.** / Consult your health care provider.  Hepatitis A vaccine.** / Consult your health care provider.  Hepatitis B vaccine.** / Consult your health care provider.  Haemophilus influenzae type b (Hib) vaccine.** / Consult your health care provider. ** Family history and personal history of risk and conditions may change your health care provider's recommendations.   This information is not intended to replace advice given to you by your health care provider. Make sure you discuss any questions you have with your health care provider.   Document Released: 06/19/2001 Document Revised: 05/14/2014 Document Reviewed: 09/18/2010 Elsevier Interactive Patient Education [employer].

## 2016-03-07 NOTE — Progress Notes (Signed)
Subjective:   Denise Gaines is a 40 y.o. G51P2002 Caucasian female here for a routine well-woman exam.  No LMP recorded. Patient is not currently having periods (Reason: IUD).    Current complaints: none PCP: me       does desire labs  Social History: Sexual: heterosexual Marital Status: married Living situation: with family Occupation: Astronomer at [employer] Tobacco/alcohol: no tobacco use Illicit drugs: no history of illicit drug use  The following portions of the patient's history were reviewed and updated as appropriate: allergies, current medications, past family history, past medical history, past social history, past surgical history and problem list.  Past Medical History Past Medical History:  Diagnosis Date  . Depression   . Obesity     Past Surgical History Past Surgical History:  Procedure Laterality Date  . NASAL SINUS SURGERY  [id number]    Gynecologic History G2P2002  No LMP recorded. Patient is not currently having periods (Reason: IUD). Contraception: IUD Last Pap: 2014. Results were: normal Last mammogram: 2017. Results were: abnormal With normal biopsy  Obstetric History OB History  Gravida Para Term Preterm AB Living  2 2 2     2   SAB TAB Ectopic Multiple Live Births               # Outcome Date GA Lbr Len/2nd Weight Sex Delivery Anes PTL Lv  2 Term           1 Term               Current Medications Current Outpatient Prescriptions on File Prior to Visit  Medication Sig Dispense Refill  . ALPRAZolam (XANAX) 0.5 MG tablet Take 1 tablet (0.5 mg total) by mouth 2 (two) times daily as needed. for anxiety 60 tablet 2  . citalopram (CELEXA) 20 MG tablet Take 1 to 2 tablets q day 60 tablet 6  . etodolac (LODINE) 500 MG tablet Take 1 tablet (500 mg total) by mouth 2 (two) times daily. 60 tablet 3  . levonorgestrel (MIRENA) 20 MCG/24HR IUD 1 each by Intrauterine route once.    Marland Kitchen zolpidem (AMBIEN CR) 12.5 MG CR tablet TAKE ONE TABLET BY MOUTH AT  BEDTIME AS NEEDED FOR SLEEP 30 tablet 5  . azithromycin (ZITHROMAX Z-PAK) 250 MG tablet 2 pills today then 1 pill a day for 4 days (Patient not taking: Reported on 03/07/2016) 6 each 0  . fluconazole (DIFLUCAN) 150 MG tablet Take 1 tablet (150 mg total) by mouth once. (Patient not taking: Reported on 03/07/2016) 1 tablet 0  . predniSONE (DELTASONE) 10 MG tablet Take 3 tablets (30 mg total) by mouth daily with breakfast. (Patient not taking: Reported on 03/07/2016) 9 tablet 0   No current facility-administered medications on file prior to visit.     Review of Systems Patient denies any headaches, blurred vision, shortness of breath, chest pain, abdominal pain, problems with bowel movements, urination, or intercourse.  Objective:  BP 103/76   Pulse 84   Ht 5\' 6"  (1.676 m)   Wt 240 lb (108.9 kg)   BMI 38.74 kg/m  Physical Exam  General:  Well developed, well nourished, no acute distress. She is alert and oriented x3. Skin:  Warm and dry Neck:  Midline trachea, no thyromegaly or nodules Cardiovascular: Regular rate and rhythm, no murmur heard Lungs:  Effort normal, all lung fields clear to auscultation bilaterally Breasts:  No dominant palpable mass, retraction, or nipple discharge Abdomen:  Soft, non tender, no hepatosplenomegaly or masses Pelvic:  External genitalia is normal in appearance.  The vagina is normal in appearance. IUD string noted. The cervix is bulbous, no CMT.  Thin prep pap is done with HR HPV cotesting. Uterus is felt to be normal size, shape, and contour.  No adnexal masses or tenderness noted. Extremities:  No swelling or varicosities noted Psych:  She has a normal mood and affect  Assessment:   Healthy well-woman exam  Plan:  Labs obtained F/U 1 year for AE, or sooner if needed Mammogram ordered  Embry Manrique Rockney Ghee, CNM

## 2016-03-08 LAB — COMPREHENSIVE METABOLIC PANEL
ALBUMIN: 4.1 g/dL (ref 3.5–5.5)
ALK PHOS: 92 IU/L (ref 39–117)
ALT: 11 IU/L (ref 0–32)
AST: 19 IU/L (ref 0–40)
Albumin/Globulin Ratio: 1.7 (ref 1.2–2.2)
BILIRUBIN TOTAL: 0.3 mg/dL (ref 0.0–1.2)
BUN / CREAT RATIO: 13 (ref 9–23)
BUN: 9 mg/dL (ref 6–24)
CHLORIDE: 102 mmol/L (ref 96–106)
CO2: 18 mmol/L (ref 18–29)
CREATININE: 0.7 mg/dL (ref 0.57–1.00)
Calcium: 8.9 mg/dL (ref 8.7–10.2)
GFR calc Af Amer: 124 mL/min/{1.73_m2} (ref >59)
GFR calc non Af Amer: 108 mL/min/{1.73_m2} (ref >59)
GLUCOSE: 59 mg/dL — AB (ref 65–99)
Globulin, Total: 2.4 g/dL (ref 1.5–4.5)
Potassium: 4.4 mmol/L (ref 3.5–5.2)
Sodium: 139 mmol/L (ref 134–144)
Total Protein: 6.5 g/dL (ref 6.0–8.5)

## 2016-03-08 LAB — LIPID PANEL
CHOL/HDL RATIO: 2.9 ratio (ref 0.0–4.4)
Cholesterol, Total: 143 mg/dL (ref 100–199)
HDL: 49 mg/dL (ref >39)
LDL CALC: 76 mg/dL (ref 0–99)
Triglycerides: 90 mg/dL (ref 0–149)
VLDL CHOLESTEROL CAL: 18 mg/dL (ref 5–40)

## 2016-03-08 LAB — VITAMIN D 25 HYDROXY (VIT D DEFICIENCY, FRACTURES): VIT D 25 HYDROXY: 39.1 ng/mL (ref 30.0–100.0)

## 2016-03-08 LAB — CYTOLOGY - PAP

## 2016-03-14 ENCOUNTER — Other Ambulatory Visit: Payer: Self-pay | Admitting: Obstetrics and Gynecology

## 2016-03-14 NOTE — Telephone Encounter (Signed)
See message for refill 

## 2016-03-15 ENCOUNTER — Encounter: Payer: Self-pay | Admitting: Obstetrics and Gynecology

## 2016-03-16 ENCOUNTER — Other Ambulatory Visit: Payer: Self-pay | Admitting: Obstetrics and Gynecology

## 2016-03-16 MED ORDER — TRIAZOLAM 0.25 MG PO TABS: 0.2500 mg | ORAL_TABLET | Freq: Every evening | ORAL | 0 refills | Status: DC | PRN

## 2016-03-19 ENCOUNTER — Ambulatory Visit
Admission: RE | Admit: 2016-03-19 | Discharge: 2016-03-19 | Disposition: A | Discharge status: Home / Self Care | Payer: 59 | Source: Ambulatory Visit | Attending: Obstetrics and Gynecology | Admitting: Obstetrics and Gynecology

## 2016-03-19 DIAGNOSIS — R928 Other abnormal and inconclusive findings on diagnostic imaging of breast: Secondary | ICD-10-CM | POA: Diagnosis not present

## 2016-03-19 DIAGNOSIS — Z1231 Encounter for screening mammogram for malignant neoplasm of breast: Secondary | ICD-10-CM | POA: Insufficient documentation

## 2016-03-19 DIAGNOSIS — Z01419 Encounter for gynecological examination (general) (routine) without abnormal findings: Secondary | ICD-10-CM | POA: Insufficient documentation

## 2016-03-20 ENCOUNTER — Other Ambulatory Visit: Payer: Self-pay | Admitting: Obstetrics and Gynecology

## 2016-03-20 DIAGNOSIS — N6489 Other specified disorders of breast: Secondary | ICD-10-CM

## 2016-04-03 ENCOUNTER — Ambulatory Visit
Admission: RE | Admit: 2016-04-03 | Discharge: 2016-04-03 | Disposition: A | Discharge status: Home / Self Care | Payer: 59 | Source: Ambulatory Visit | Attending: Obstetrics and Gynecology | Admitting: Obstetrics and Gynecology

## 2016-04-03 DIAGNOSIS — N6489 Other specified disorders of breast: Secondary | ICD-10-CM | POA: Diagnosis not present

## 2016-04-10 ENCOUNTER — Encounter: Payer: Self-pay | Admitting: Obstetrics and Gynecology

## 2016-04-11 ENCOUNTER — Other Ambulatory Visit: Payer: Self-pay | Admitting: Obstetrics and Gynecology

## 2016-04-11 MED ORDER — TRIAZOLAM 0.25 MG PO TABS: 0.5000 mg | ORAL_TABLET | Freq: Every evening | ORAL | 2 refills | Status: DC | PRN

## 2016-05-14 ENCOUNTER — Ambulatory Visit: Payer: Self-pay | Admitting: Physician Assistant

## 2016-05-14 ENCOUNTER — Encounter: Payer: Self-pay | Admitting: Physician Assistant

## 2016-05-14 DIAGNOSIS — H6982 Other specified disorders of Eustachian tube, left ear: Secondary | ICD-10-CM

## 2016-05-14 MED ORDER — PREDNISONE 10 MG PO TABS: 30.0000 mg | ORAL_TABLET | Freq: Every day | ORAL | 0 refills | Status: DC

## 2016-05-14 MED ORDER — TRAMADOL HCL 50 MG PO TABS: 50.0000 mg | ORAL_TABLET | Freq: Three times a day (TID) | ORAL | 0 refills | Status: DC | PRN

## 2016-05-14 MED ORDER — AMOXICILLIN 875 MG PO TABS: 875.0000 mg | ORAL_TABLET | Freq: Two times a day (BID) | ORAL | 0 refills | Status: DC

## 2016-05-14 MED ORDER — FLUCONAZOLE 150 MG PO TABS: 150.0000 mg | ORAL_TABLET | Freq: Once | ORAL | 0 refills | Status: AC

## 2016-05-14 NOTE — Progress Notes (Signed)
S:  C/o ears popping and being stopped up, no drainage from ears, no fever/chills, no cough or congestion, some sinus pressure, left ear has been really painful, remainder ros neg Using otc meds without relief  O:  Vitals wnl, nad, tms dull b/l, let ear is pink and swollen, nasal mucosa swollen, throat wnl, neck supple no lymph, lungs c t a, cv rrr, neuro intact  A: acute eustachean tube dysfunction. AOM left ear  P: flonase, sudafed, prednisone 30mg  qd x 3d, amoxil, diflucan, tramadol 50mg  #15 nr; return if not improving in 3 to 5 days, return earlier if worsening

## 2016-05-25 ENCOUNTER — Other Ambulatory Visit: Payer: Self-pay | Admitting: Obstetrics and Gynecology

## 2016-07-16 ENCOUNTER — Other Ambulatory Visit: Payer: Self-pay | Admitting: Physician Assistant

## 2016-07-16 NOTE — Telephone Encounter (Signed)
Med refill for citalopram approved

## 2016-08-03 ENCOUNTER — Telehealth: Payer: 59 | Admitting: Nurse Practitioner

## 2016-08-03 DIAGNOSIS — B850 Pediculosis due to Pediculus humanus capitis: Secondary | ICD-10-CM

## 2016-08-03 NOTE — Progress Notes (Signed)
I am so sorry but we cannot treat children in an evisit. I wish I could help you- you can call her pediatrician and they should be happy to send something in for her. You will not be charged for this e visit.

## 2016-09-10 ENCOUNTER — Other Ambulatory Visit: Payer: Self-pay | Admitting: Obstetrics and Gynecology

## 2016-09-10 ENCOUNTER — Telehealth: Payer: 59 | Admitting: Family

## 2016-09-10 DIAGNOSIS — J019 Acute sinusitis, unspecified: Secondary | ICD-10-CM

## 2016-09-10 MED ORDER — AMOXICILLIN-POT CLAVULANATE 875-125 MG PO TABS: 1.0000 | ORAL_TABLET | Freq: Two times a day (BID) | ORAL | 0 refills | Status: DC

## 2016-09-10 NOTE — Progress Notes (Signed)

## 2016-10-02 ENCOUNTER — Other Ambulatory Visit: Payer: Self-pay | Admitting: Physician Assistant

## 2016-10-02 DIAGNOSIS — M722 Plantar fascial fibromatosis: Secondary | ICD-10-CM

## 2016-10-03 ENCOUNTER — Other Ambulatory Visit: Payer: Self-pay | Admitting: Obstetrics and Gynecology

## 2016-10-04 ENCOUNTER — Encounter: Payer: Self-pay | Admitting: Obstetrics and Gynecology

## 2016-10-19 MED FILL — CITALOPRAM HBR 20 MG TABLET: 20 | 30 days supply | Qty: 60 | Fill #0

## 2016-10-26 ENCOUNTER — Other Ambulatory Visit: Payer: Self-pay | Admitting: Obstetrics and Gynecology

## 2016-11-19 MED FILL — CITALOPRAM HBR 20 MG TABLET: 20 | 30 days supply | Qty: 60 | Fill #1

## 2016-11-23 ENCOUNTER — Encounter: Payer: Self-pay | Admitting: Obstetrics and Gynecology

## 2016-12-04 ENCOUNTER — Other Ambulatory Visit: Payer: Self-pay | Admitting: Obstetrics and Gynecology

## 2016-12-05 ENCOUNTER — Encounter: Payer: Self-pay | Admitting: Obstetrics and Gynecology

## 2016-12-05 ENCOUNTER — Ambulatory Visit (INDEPENDENT_AMBULATORY_CARE_PROVIDER_SITE_OTHER): Payer: BLUE CROSS/BLUE SHIELD | Admitting: Obstetrics and Gynecology

## 2016-12-05 VITALS — BP 95/62 | HR 63 | Ht 66.0 in | Wt 250.6 lb

## 2016-12-05 DIAGNOSIS — R4586 Emotional lability: Secondary | ICD-10-CM

## 2016-12-05 DIAGNOSIS — F3281 Premenstrual dysphoric disorder: Secondary | ICD-10-CM

## 2016-12-05 DIAGNOSIS — F39 Unspecified mood [affective] disorder: Secondary | ICD-10-CM | POA: Diagnosis not present

## 2016-12-05 MED ORDER — NORETHIN-ETH ESTRAD-FE BIPHAS 1 MG-10 MCG / 10 MCG PO TABS: 1.0000 | ORAL_TABLET | Freq: Every day | ORAL | 11 refills | Status: DC

## 2016-12-05 NOTE — Progress Notes (Signed)
Subjective:     Patient ID: Denise Gaines, female   DOB: 06/13/1975, 41 y.o.   MRN: [id number]  HPI States onset of mood swings six months ago, feels like it is hormonal. States one week  Feels very tearful and down for a week, then irritable for a week with nightmares x 3-4 nights, then better for 2 weeks. Is having menses monthly and thinks it might occur before menses. Has Mirena in place and is due to be changed next year.having 2-3 hot flashes a week. States no new stressors and that life is great at this time. Has not changed medications.   Review of Systems Negative except stated above in HPI    Objective:   Physical Exam A&O x4 Well groomed female in no distress Blood pressure 95/62, pulse 63, height 5\' 6"  (1.676 m), weight 250 lb 9.6 oz (113.7 kg). PE not indicated    Assessment:     PMDD Mood swings    Plan:     Will try course of low dose OCPs and see if symptoms improve or resolve, also may consider replacing IUD to see if that helps. Do not think we need to adjust SSRI at this time. Will message me on MyChart in 6 weeks and let me know how she is feeling. RTC as needed or at planned physical in November.  Melody [place], CNM

## 2016-12-19 MED FILL — CITALOPRAM HBR 20 MG TABLET: 20 | 30 days supply | Qty: 60 | Fill #2

## 2016-12-27 ENCOUNTER — Other Ambulatory Visit: Payer: Self-pay | Admitting: Obstetrics and Gynecology

## 2017-01-01 ENCOUNTER — Encounter: Payer: 59 | Admitting: Obstetrics and Gynecology

## 2017-01-01 ENCOUNTER — Encounter: Payer: Self-pay | Admitting: Obstetrics and Gynecology

## 2017-01-14 MED FILL — CITALOPRAM HBR 20 MG TABLET: 20 | 30 days supply | Qty: 60 | Fill #3

## 2017-02-06 ENCOUNTER — Encounter: Payer: Self-pay | Admitting: Obstetrics and Gynecology

## 2017-02-06 ENCOUNTER — Other Ambulatory Visit: Payer: Self-pay | Admitting: Physician Assistant

## 2017-02-06 MED ORDER — CITALOPRAM HYDROBROMIDE 20 MG PO TABS: 20.0000 mg | ORAL_TABLET | Freq: Every day | ORAL | 6 refills | Status: DC

## 2017-02-18 ENCOUNTER — Telehealth: Payer: BLUE CROSS/BLUE SHIELD | Admitting: Family

## 2017-02-18 DIAGNOSIS — J329 Chronic sinusitis, unspecified: Secondary | ICD-10-CM

## 2017-02-18 DIAGNOSIS — B9689 Other specified bacterial agents as the cause of diseases classified elsewhere: Secondary | ICD-10-CM

## 2017-02-18 MED ORDER — AMOXICILLIN-POT CLAVULANATE 875-125 MG PO TABS: 1.0000 | ORAL_TABLET | Freq: Two times a day (BID) | ORAL | 0 refills | Status: AC

## 2017-02-18 NOTE — Progress Notes (Signed)

## 2017-02-26 ENCOUNTER — Encounter: Payer: Self-pay | Admitting: Physician Assistant

## 2017-02-26 ENCOUNTER — Ambulatory Visit: Payer: Self-pay | Admitting: Physician Assistant

## 2017-02-26 VITALS — BP 100/72 | HR 77 | Temp 98.4°F

## 2017-02-26 DIAGNOSIS — H6983 Other specified disorders of Eustachian tube, bilateral: Secondary | ICD-10-CM

## 2017-02-26 DIAGNOSIS — J0100 Acute maxillary sinusitis, unspecified: Secondary | ICD-10-CM

## 2017-02-26 MED ORDER — FLUCONAZOLE 150 MG PO TABS: ORAL_TABLET | ORAL | 0 refills | Status: DC

## 2017-02-26 MED ORDER — AMOXICILLIN-POT CLAVULANATE 875-125 MG PO TABS: 1.0000 | ORAL_TABLET | Freq: Two times a day (BID) | ORAL | 0 refills | Status: DC

## 2017-02-26 MED ORDER — METHYLPREDNISOLONE 4 MG PO TBPK: ORAL_TABLET | ORAL | 0 refills | Status: DC

## 2017-02-26 NOTE — Progress Notes (Signed)
S: C/o sinus pain, ears popping, runny nose and congestion for over 2 weeks, no fever, chills, cp/sob, v/d; mucus is green and thick, cough is sporadic, c/o of facial and dental pain. Did an evisit and was given augmentin for 7 days, now her ears are hurting  Using otc meds:   O: PE: vitals wnl, nad, perrl eomi, normocephalic, tms dull, nasal mucosa red and swollen, throat injected, neck supple no lymph, lungs c t a, cv rrr, neuro intact  A:  Acute sinusitis, eustachian tube dysfunction  P: drink fluids, continue regular meds , use otc meds of choice, return if not improving in 5 days, return earlier if worsening , augmentin for 7 more day, diflucan, medrol dose pack

## 2017-02-28 ENCOUNTER — Other Ambulatory Visit: Payer: Self-pay | Admitting: Obstetrics and Gynecology

## 2017-02-28 ENCOUNTER — Encounter: Payer: Self-pay | Admitting: Physician Assistant

## 2017-02-28 NOTE — Progress Notes (Signed)
Patient inbox on 02/28/2017 stating that she wasn't feeling any better and was unable to get away to be seen requested Levaquin 500mg . Spoke with Manuela Schwartz ok to call into pharmacy. Spoke with Kathlee Nations at [facility] Levaquin 500mg  1po qd x 7d no refill. Patient is to d/c augmentin patient acknowledge understanding.

## 2017-03-04 ENCOUNTER — Encounter: Payer: Self-pay | Admitting: Obstetrics and Gynecology

## 2017-03-07 ENCOUNTER — Other Ambulatory Visit: Payer: Self-pay | Admitting: Obstetrics and Gynecology

## 2017-03-07 DIAGNOSIS — Z1231 Encounter for screening mammogram for malignant neoplasm of breast: Secondary | ICD-10-CM

## 2017-03-12 ENCOUNTER — Ambulatory Visit (INDEPENDENT_AMBULATORY_CARE_PROVIDER_SITE_OTHER): Payer: BLUE CROSS/BLUE SHIELD | Admitting: Obstetrics and Gynecology

## 2017-03-12 ENCOUNTER — Encounter: Payer: Self-pay | Admitting: Obstetrics and Gynecology

## 2017-03-12 VITALS — BP 107/82 | HR 81 | Ht 66.0 in | Wt 252.0 lb

## 2017-03-12 DIAGNOSIS — Z01419 Encounter for gynecological examination (general) (routine) without abnormal findings: Secondary | ICD-10-CM

## 2017-03-12 NOTE — Progress Notes (Signed)
Subjective:   Denise Gaines is a 41 y.o. G63P2002 Caucasian female here for a routine well-woman exam.  No LMP recorded. Patient is not currently having periods (Reason: IUD).    Current complaints: feeling better and not having the mod swings, sleeping better.and nightmares have stopped. States she is 90% better. Still bothered by weight. PCP: me       does desire labs  Social History: Sexual: heterosexual Marital Status: married Living situation: with family Occupation: Astronomer Tobacco/alcohol: no tobacco use Illicit drugs: no history of illicit drug use  The following portions of the patient's history were reviewed and updated as appropriate: allergies, current medications, past family history, past medical history, past social history, past surgical history and problem list.  Past Medical History Past Medical History:  Diagnosis Date  . Depression   . Obesity     Past Surgical History Past Surgical History:  Procedure Laterality Date  . BREAST BIOPSY Right 2017   NEG  . NASAL SINUS SURGERY  [id number]    Gynecologic History G2P2002  No LMP recorded. Patient is not currently having periods (Reason: IUD). Contraception: IUD Last Pap: 2017. Results were: normal Last mammogram: 2017. Results were: normal   Obstetric History OB History  Gravida Para Term Preterm AB Living  2 2 2     2   SAB TAB Ectopic Multiple Live Births               # Outcome Date GA Lbr Len/2nd Weight Sex Delivery Anes PTL Lv  2 Term           1 Term               Current Medications Current Outpatient Medications on File Prior to Visit  Medication Sig Dispense Refill  . ALPRAZolam (XANAX) 0.5 MG tablet TAKE ONE TABLET BY MOUTH 2 TIMES A DAY AS NEEDED 60 tablet 2  . citalopram (CELEXA) 20 MG tablet Take 1-2 tablets (20-40 mg total) by mouth daily. 60 tablet 6  . levonorgestrel (MIRENA) 20 MCG/24HR IUD 1 each by Intrauterine route once.    . Norethindrone-Ethinyl Estradiol-Fe Biphas  (LO LOESTRIN FE) 1 MG-10 MCG / 10 MCG tablet Take 1 tablet by mouth daily. 1 Package 11  . triazolam (HALCION) 0.25 MG tablet TAKE 1 TABLET BY MOUTH EVERY NIGHT AT BEDTIME AS NEEDED FOR SLEEP 30 tablet 3  . amoxicillin-clavulanate (AUGMENTIN) 875-125 MG tablet Take 1 tablet by mouth 2 (two) times daily. (Patient not taking: Reported on 03/12/2017) 14 tablet 0  . fluconazole (DIFLUCAN) 150 MG tablet Take one now and one in a week (Patient not taking: Reported on 03/12/2017) 2 tablet 0  . methylPREDNISolone (MEDROL DOSEPAK) 4 MG TBPK tablet Take 6 pills on day one then decrease by 1 pill each day (Patient not taking: Reported on 03/12/2017) 21 tablet 0   No current facility-administered medications on file prior to visit.     Review of Systems Patient denies any headaches, blurred vision, shortness of breath, chest pain, abdominal pain, problems with bowel movements, urination, or intercourse.  Objective:  BP 107/82   Pulse 81   Ht 5\' 6"  (1.676 m)   Wt 252 lb (114.3 kg)   BMI 40.67 kg/m  Physical Exam  General:  Well developed, well nourished, no acute distress. She is alert and oriented x3. Skin:  Warm and dry Neck:  Midline trachea, no thyromegaly or nodules Cardiovascular: Regular rate and rhythm, no murmur heard Lungs:  Effort normal, all lung  fields clear to auscultation bilaterally Breasts:  No dominant palpable mass, retraction, or nipple discharge Abdomen:  Soft, non tender, no hepatosplenomegaly or masses Pelvic:  External genitalia is normal in appearance.  The vagina is normal in appearance. The cervix is bulbous, no CMT.  Thin prep pap is not done . Uterus is felt to be normal size, shape, and contour.  IUD string noted. No adnexal masses or tenderness noted. Extremities:  No swelling or varicosities noted Psych:  She has a normal mood and affect  Assessment:   Healthy well-woman exam  Plan:  Labs obtained -will follow up accordingly F/U 1 year for AE, or sooner if  needed Mammogram ordered Denise Gaines, CNM

## 2017-03-13 LAB — COMPREHENSIVE METABOLIC PANEL WITH GFR
ALT: 12 IU/L (ref 0–32)
AST: 15 IU/L (ref 0–40)
Albumin/Globulin Ratio: 2 (ref 1.2–2.2)
Albumin: 4.2 g/dL (ref 3.5–5.5)
Alkaline Phosphatase: 80 IU/L (ref 39–117)
BUN/Creatinine Ratio: 13 (ref 9–23)
BUN: 10 mg/dL (ref 6–24)
Bilirubin Total: 0.4 mg/dL (ref 0.0–1.2)
CO2: 21 mmol/L (ref 20–29)
Calcium: 8.9 mg/dL (ref 8.7–10.2)
Chloride: 105 mmol/L (ref 96–106)
Creatinine, Ser: 0.75 mg/dL (ref 0.57–1.00)
GFR calc Af Amer: 114 mL/min/1.73 (ref >59)
GFR calc non Af Amer: 99 mL/min/1.73 (ref >59)
Globulin, Total: 2.1 g/dL (ref 1.5–4.5)
Glucose: 78 mg/dL (ref 65–99)
Potassium: 4.9 mmol/L (ref 3.5–5.2)
Sodium: 139 mmol/L (ref 134–144)
Total Protein: 6.3 g/dL (ref 6.0–8.5)

## 2017-03-13 LAB — LIPID PANEL
CHOLESTEROL TOTAL: 172 mg/dL (ref 100–199)
Chol/HDL Ratio: 3.6 ratio (ref 0.0–4.4)
HDL: 48 mg/dL (ref >39)
LDL Calculated: 104 mg/dL — ABNORMAL HIGH (ref 0–99)
Triglycerides: 99 mg/dL (ref 0–149)
VLDL Cholesterol Cal: 20 mg/dL (ref 5–40)

## 2017-03-13 LAB — TSH: TSH: 1.58 u[IU]/mL (ref 0.450–4.500)

## 2017-03-19 ENCOUNTER — Encounter: Payer: BLUE CROSS/BLUE SHIELD | Admitting: Obstetrics and Gynecology

## 2017-03-20 ENCOUNTER — Encounter: Payer: BLUE CROSS/BLUE SHIELD | Admitting: Obstetrics and Gynecology

## 2017-04-08 ENCOUNTER — Encounter: Payer: Self-pay | Admitting: Obstetrics and Gynecology

## 2017-04-08 ENCOUNTER — Other Ambulatory Visit: Payer: Self-pay | Admitting: *Deleted

## 2017-04-08 MED ORDER — TRIAZOLAM 0.25 MG PO TABS: 0.2500 mg | ORAL_TABLET | Freq: Every evening | ORAL | 3 refills | Status: DC | PRN

## 2017-04-10 ENCOUNTER — Ambulatory Visit
Admission: RE | Admit: 2017-04-10 | Discharge: 2017-04-10 | Disposition: A | Discharge status: Home / Self Care | Payer: BLUE CROSS/BLUE SHIELD | Source: Ambulatory Visit | Attending: Obstetrics and Gynecology | Admitting: Obstetrics and Gynecology

## 2017-04-10 ENCOUNTER — Other Ambulatory Visit: Payer: Self-pay | Admitting: *Deleted

## 2017-04-10 DIAGNOSIS — Z1231 Encounter for screening mammogram for malignant neoplasm of breast: Secondary | ICD-10-CM | POA: Insufficient documentation

## 2017-04-10 MED ORDER — TRIAZOLAM 0.25 MG PO TABS: 0.5000 mg | ORAL_TABLET | Freq: Every day | ORAL | 3 refills | Status: DC

## 2017-05-27 ENCOUNTER — Other Ambulatory Visit: Payer: Self-pay | Admitting: Obstetrics and Gynecology

## 2017-05-28 ENCOUNTER — Encounter: Payer: Self-pay | Admitting: Obstetrics and Gynecology

## 2017-06-03 ENCOUNTER — Telehealth: Payer: BLUE CROSS/BLUE SHIELD | Admitting: Family

## 2017-06-03 DIAGNOSIS — J019 Acute sinusitis, unspecified: Secondary | ICD-10-CM

## 2017-06-03 MED ORDER — AMOXICILLIN-POT CLAVULANATE 875-125 MG PO TABS: 1.0000 | ORAL_TABLET | Freq: Two times a day (BID) | ORAL | 0 refills | Status: DC

## 2017-06-03 NOTE — Progress Notes (Signed)

## 2017-07-30 ENCOUNTER — Other Ambulatory Visit: Payer: Self-pay | Admitting: Obstetrics and Gynecology

## 2017-07-30 ENCOUNTER — Other Ambulatory Visit: Payer: Self-pay | Admitting: *Deleted

## 2017-07-30 ENCOUNTER — Encounter: Payer: Self-pay | Admitting: Obstetrics and Gynecology

## 2017-07-30 MED ORDER — ZOLPIDEM TARTRATE ER 12.5 MG PO TBCR: 12.5000 mg | EXTENDED_RELEASE_TABLET | Freq: Every evening | ORAL | 3 refills | Status: DC | PRN

## 2017-08-17 ENCOUNTER — Other Ambulatory Visit: Payer: Self-pay | Admitting: Obstetrics and Gynecology

## 2017-08-18 ENCOUNTER — Other Ambulatory Visit: Payer: Self-pay | Admitting: Obstetrics and Gynecology

## 2017-08-19 ENCOUNTER — Other Ambulatory Visit: Payer: Self-pay | Admitting: *Deleted

## 2017-08-19 ENCOUNTER — Encounter: Payer: Self-pay | Admitting: Obstetrics and Gynecology

## 2017-08-19 MED ORDER — CITALOPRAM HYDROBROMIDE 20 MG PO TABS: 20.0000 mg | ORAL_TABLET | Freq: Every day | ORAL | 6 refills | Status: DC

## 2017-08-19 MED ORDER — ALPRAZOLAM 0.5 MG PO TABS: 0.5000 mg | ORAL_TABLET | Freq: Two times a day (BID) | ORAL | 2 refills | Status: DC | PRN

## 2017-08-20 NOTE — Telephone Encounter (Signed)
Done-ac 

## 2017-09-06 ENCOUNTER — Telehealth: Payer: BLUE CROSS/BLUE SHIELD | Admitting: Family

## 2017-09-06 DIAGNOSIS — B9689 Other specified bacterial agents as the cause of diseases classified elsewhere: Secondary | ICD-10-CM

## 2017-09-06 DIAGNOSIS — J329 Chronic sinusitis, unspecified: Secondary | ICD-10-CM

## 2017-09-06 MED ORDER — AMOXICILLIN-POT CLAVULANATE 875-125 MG PO TABS: 1.0000 | ORAL_TABLET | Freq: Two times a day (BID) | ORAL | 0 refills | Status: AC

## 2017-09-06 MED ORDER — FLUTICASONE PROPIONATE 50 MCG/ACT NA SUSP: 2.0000 | Freq: Every day | NASAL | 2 refills | Status: DC

## 2017-09-06 MED FILL — AMOX-CLAV 875-125 MG TABLET: 875-125 | 7 days supply | Qty: 14 | Fill #0

## 2017-09-06 MED FILL — FLUTICASONE PROP 50 MCG SPR: 50 | 30 days supply | Qty: 16 | Fill #0

## 2017-09-06 NOTE — Progress Notes (Signed)
Thank you for the details you included in the comment boxes. Those details are very helpful in determining the best course of treatment for you and help [place] to provide the best care. In the past, we would often give prednisone. However, it is now the policy of the E-visit program that we cannot offer prednisone for these types of situations. The change in policy was due to recent evidence about lack of effectiveness of prednisone in these cases. The best treatment is to clear the infection as fast as possible along with the care below.   We are sorry that you are not feeling well.  Here is how we plan to help!  Based on what you have shared with me it looks like you have sinusitis.  Sinusitis is inflammation and infection in the sinus cavities of the head.  Based on your presentation I believe you most likely have Acute Bacterial Sinusitis.  This is an infection caused by bacteria and is treated with antibiotics. I have prescribed Augmentin 875mg /125mg  one tablet twice daily with food, for 7 days. You may use an oral decongestant such as Mucinex D or if you have glaucoma or high blood pressure use plain Mucinex. Saline nasal spray help and can safely be used as often as needed for congestion.  If you develop worsening sinus pain, fever or notice severe headache and vision changes, or if symptoms are not better after completion of antibiotic, please schedule an appointment with a health care provider.    Additionally, I have prescribed Flonase 68mcg, you may use 2 spras in each nare daily.   Sinus infections are not as easily transmitted as other respiratory infection, however we still recommend that you avoid close contact with loved ones, especially the very young and elderly.  Remember to wash your hands thoroughly throughout the day as this is the number one way to prevent the spread of infection!  Home Care:  Only take medications as instructed by your medical team.  Complete the entire course of an  antibiotic.  Do not take these medications with alcohol.  A steam or ultrasonic humidifier can help congestion.  You can place a towel over your head and breathe in the steam from hot water coming from a faucet.  Avoid close contacts especially the very young and the elderly.  Cover your mouth when you cough or sneeze.  Always remember to wash your hands.  Get Help Right Away If:  You develop worsening fever or sinus pain.  You develop a severe head ache or visual changes.  Your symptoms persist after you have completed your treatment plan.  Make sure you  Understand these instructions.  Will watch your condition.  Will get help right away if you are not doing well or get worse.  Your e-visit answers were reviewed by a board certified advanced clinical practitioner to complete your personal care plan.  Depending on the condition, your plan could have included both over the counter or prescription medications.  If there is a problem please reply  once you have received a response from your provider.  Your safety is important to [place].  If you have drug allergies check your prescription carefully.    You can use MyChart to ask questions about today's visit, request a non-urgent call back, or ask for a work or school excuse for 24 hours related to this e-Visit. If it has been greater than 24 hours you will need to follow up with your provider, or enter  a new e-Visit to address those concerns.  You will get an e-mail in the next two days asking about your experience.  I hope that your e-visit has been valuable and will speed your recovery. Thank you for using e-visits.

## 2017-09-10 ENCOUNTER — Encounter: Payer: Self-pay | Admitting: Obstetrics and Gynecology

## 2017-09-10 ENCOUNTER — Ambulatory Visit (INDEPENDENT_AMBULATORY_CARE_PROVIDER_SITE_OTHER): Payer: BLUE CROSS/BLUE SHIELD | Admitting: Obstetrics and Gynecology

## 2017-09-10 VITALS — BP 100/71 | HR 75 | Ht 66.0 in | Wt 255.0 lb

## 2017-09-10 DIAGNOSIS — Z30433 Encounter for removal and reinsertion of intrauterine contraceptive device: Secondary | ICD-10-CM | POA: Diagnosis not present

## 2017-09-10 MED ORDER — FLUCONAZOLE 150 MG PO TABS: 150.0000 mg | ORAL_TABLET | Freq: Once | ORAL | 3 refills | Status: AC

## 2017-09-10 NOTE — Progress Notes (Signed)
Denise Gaines is a 42 y.o. year old G51P2002 Caucasian female who presents for removal and replacement of a Mirena IUD. She was given informed consent for removal and reinsertion of her Mirena. Her Mirena was placed 2014, No LMP recorded. (Menstrual status: IUD)., and her pregnancy test today was negative.   The risks and benefits of the method and placement have been thouroughly reviewed with the patient and all questions were answered.  Specifically the patient is aware of failure rate of 05/998, expulsion of the IUD and of possible perforation.  The patient is aware of irregular bleeding due to the method and understands the incidence of irregular bleeding diminishes with time.  Signed copy of informed consent in chart.   No LMP recorded. (Menstrual status: IUD). BP 100/71   Pulse 75   Ht 5\' 6"  (1.676 m)   Wt 255 lb (115.7 kg)   BMI 41.16 kg/m  No results found for this or any previous visit (from the past 24 hour(s)).   Appropriate time out taken. A graves speculum was placed in the vagina.  The cervix was visualized, prepped using Betadine. The strings were visible. They were grasped and the Mirena was easily removed. The cervix was then grasped with a single-tooth tenaculum. The uterus was found to be neutral and it sounded to 7 cm.  Mirena IUD placed per manufacturer's recommendations without complications. The strings were trimmed to 3 cm.  The patient tolerated the procedure well.   Sonogram was performed and the proper placement of the IUD was verified via transvaginal u/s.   The patient was given post procedure instructions, including signs and symptoms of infection and to check for the strings after each menses or each month, and refraining from intercourse or anything in the vagina for 3 days.  She was given a Mirena care card with date Mirena placed, and date Mirena to be removed.    Melody Rockney Ghee, CNM

## 2017-09-10 NOTE — Patient Instructions (Signed)

## 2017-10-10 ENCOUNTER — Other Ambulatory Visit: Payer: Self-pay | Admitting: Obstetrics and Gynecology

## 2017-10-11 ENCOUNTER — Encounter: Payer: Self-pay | Admitting: Obstetrics and Gynecology

## 2017-10-11 ENCOUNTER — Other Ambulatory Visit: Payer: Self-pay | Admitting: *Deleted

## 2017-10-11 ENCOUNTER — Ambulatory Visit (INDEPENDENT_AMBULATORY_CARE_PROVIDER_SITE_OTHER): Payer: BLUE CROSS/BLUE SHIELD | Admitting: Obstetrics and Gynecology

## 2017-10-11 VITALS — BP 98/75 | HR 79 | Ht 66.0 in | Wt 258.5 lb

## 2017-10-11 DIAGNOSIS — Z30431 Encounter for routine checking of intrauterine contraceptive device: Secondary | ICD-10-CM | POA: Diagnosis not present

## 2017-10-11 DIAGNOSIS — Z6841 Body Mass Index (BMI) 40.0 and over, adult: Secondary | ICD-10-CM | POA: Diagnosis not present

## 2017-10-11 MED ORDER — PHENTERMINE HCL 37.5 MG PO TABS: 37.5000 mg | ORAL_TABLET | Freq: Every day | ORAL | 2 refills | Status: DC

## 2017-10-11 MED ORDER — CYANOCOBALAMIN 1000 MCG/ML IJ SOLN: 1000.0000 ug | INTRAMUSCULAR | 1 refills | Status: DC

## 2017-10-11 MED ORDER — CITALOPRAM HYDROBROMIDE 20 MG PO TABS: ORAL_TABLET | ORAL | 0 refills | Status: DC

## 2017-10-11 MED ORDER — ALPRAZOLAM 0.5 MG PO TABS: 0.5000 mg | ORAL_TABLET | Freq: Two times a day (BID) | ORAL | 2 refills | Status: DC | PRN

## 2017-10-11 NOTE — Progress Notes (Signed)
  Subjective:     Patient ID: Denise Gaines, female   DOB: 22-May-1975, 42 y.o.   MRN: [id number]  HPI Here for IUD string check and to restart weight loss medications. Was exercising at work but hasn't been lately. Has rejoined weight watchers. Denies any fatigue, headaches, history of palpitation or hypertension.   Review of Systems Negative for all systems.    Objective:   Physical Exam A&Ox4 Well groomed female in no distress Blood pressure 98/75, pulse 79, height _0  (1.676 m), weight 258 lb 8 oz (117.3 kg).  Body mass index is 41.72 kg/m.  HRR, lungs clear Pelvic exam: normal external genitalia, vulva, vagina, cervix, uterus and adnexa, IUD string noted .    Assessment:     IUD check Obesity     Plan:     reassurred of normal findings. Will start weight loss program . Counseled on adipex and b12 and expectations of 10 % weight loss. First B12 shot given today. RTC in 4 weeks for wt/BP/B12.  Melody Shambley,CNM

## 2017-11-06 ENCOUNTER — Other Ambulatory Visit: Payer: Self-pay | Admitting: Obstetrics and Gynecology

## 2017-11-12 ENCOUNTER — Ambulatory Visit (INDEPENDENT_AMBULATORY_CARE_PROVIDER_SITE_OTHER): Payer: BLUE CROSS/BLUE SHIELD | Admitting: Obstetrics and Gynecology

## 2017-11-12 VITALS — BP 106/77 | HR 75 | Ht 66.0 in | Wt 247.0 lb

## 2017-11-12 DIAGNOSIS — Z6841 Body Mass Index (BMI) 40.0 and over, adult: Secondary | ICD-10-CM

## 2017-11-12 MED ORDER — CYANOCOBALAMIN 1000 MCG/ML IJ SOLN
1000.0000 ug | Freq: Once | INTRAMUSCULAR | Status: AC
  Administered 2017-11-12: 1000 ug via INTRAMUSCULAR

## 2017-11-12 NOTE — Progress Notes (Signed)
Pt is jere for wt, bp check, b-12 inj She is doing well, denies any s/e  11/12/17 wt- 247lb 10/11/17 wt- 258lb

## 2017-11-21 ENCOUNTER — Other Ambulatory Visit: Payer: Self-pay | Admitting: Obstetrics and Gynecology

## 2017-11-25 ENCOUNTER — Encounter: Payer: Self-pay | Admitting: Obstetrics and Gynecology

## 2017-12-06 ENCOUNTER — Other Ambulatory Visit: Payer: Self-pay | Admitting: Obstetrics and Gynecology

## 2017-12-08 ENCOUNTER — Encounter: Payer: Self-pay | Admitting: Obstetrics and Gynecology

## 2017-12-09 ENCOUNTER — Ambulatory Visit (INDEPENDENT_AMBULATORY_CARE_PROVIDER_SITE_OTHER): Payer: BLUE CROSS/BLUE SHIELD | Admitting: Obstetrics and Gynecology

## 2017-12-09 VITALS — BP 103/70 | HR 83 | Ht 66.0 in | Wt 241.4 lb

## 2017-12-09 DIAGNOSIS — Z6841 Body Mass Index (BMI) 40.0 and over, adult: Secondary | ICD-10-CM

## 2017-12-09 MED ORDER — CYANOCOBALAMIN 1000 MCG/ML IJ SOLN
1000.0000 ug | Freq: Once | INTRAMUSCULAR | Status: AC
  Administered 2017-12-09: 1000 ug via INTRAMUSCULAR

## 2017-12-09 NOTE — Progress Notes (Signed)
Pt presents for wt, bp and b12. Weight down 6 #. NO s/e noted. B12 used in house. Pt requests lo loestrin refill.  She states she sent a refill request on Thursday. Apologized to pt for the delay. Informed her that [facility] will be in tomorrow. I was not comfortable refilling d/t pt has iud inserted. Pt voices understanding. Encouraged her to f/u tomorrow via my chart. Pt to f/u in 4 weeks.

## 2017-12-10 ENCOUNTER — Ambulatory Visit: Payer: BLUE CROSS/BLUE SHIELD

## 2017-12-25 ENCOUNTER — Telehealth: Payer: BLUE CROSS/BLUE SHIELD | Admitting: Nurse Practitioner

## 2017-12-25 DIAGNOSIS — J0100 Acute maxillary sinusitis, unspecified: Secondary | ICD-10-CM

## 2017-12-25 MED ORDER — AMOXICILLIN-POT CLAVULANATE 875-125 MG PO TABS: 1.0000 | ORAL_TABLET | Freq: Two times a day (BID) | ORAL | 0 refills | Status: DC

## 2017-12-25 NOTE — Progress Notes (Signed)

## 2018-01-01 ENCOUNTER — Ambulatory Visit (INDEPENDENT_AMBULATORY_CARE_PROVIDER_SITE_OTHER): Payer: BLUE CROSS/BLUE SHIELD | Admitting: Obstetrics and Gynecology

## 2018-01-01 ENCOUNTER — Encounter: Payer: Self-pay | Admitting: Obstetrics and Gynecology

## 2018-01-01 VITALS — BP 116/80 | HR 88 | Ht 66.0 in | Wt 236.8 lb

## 2018-01-01 DIAGNOSIS — Z79899 Other long term (current) drug therapy: Secondary | ICD-10-CM

## 2018-01-01 DIAGNOSIS — E669 Obesity, unspecified: Secondary | ICD-10-CM

## 2018-01-01 MED ORDER — PHENTERMINE HCL 37.5 MG PO TABS: 37.5000 mg | ORAL_TABLET | Freq: Every day | ORAL | 2 refills | Status: DC

## 2018-01-01 NOTE — Progress Notes (Signed)
SUBJECTIVE:  42 y.o. here for follow-up weight loss visit, previously seen 4 weeks ago. She has lost 22#s in 3 months.  Denies any concerns and feels like medication is still working well, and hasn't had any binge eating episodes. Is exercising 3-5 times a week for 30 minutes. Doesn't need the xanax as much either, and feels more in control.   OBJECTIVE:  BP 116/80   Pulse 88   Ht 5\' 6"  (1.676 m)   Wt 236 lb 12.8 oz (107.4 kg)   LMP  (LMP Unknown)   BMI 38.22 kg/m   Body mass index is 38.22 kg/m. Patient appears well. ASSESSMENT:  Obesity- responding well to weight loss plan  PLAN:  To continue with current medications. B12 1047mcg/ml injection given Depression with anxiety- Will decrease celexa to 10mg  daily and will consider switching to different SSRI altogether. RTC in 4 weeks as planned  Karrah Mangini [employer], CNM

## 2018-01-15 ENCOUNTER — Encounter: Payer: BLUE CROSS/BLUE SHIELD | Admitting: Obstetrics and Gynecology

## 2018-01-29 ENCOUNTER — Encounter: Payer: Self-pay | Admitting: Obstetrics and Gynecology

## 2018-01-29 ENCOUNTER — Ambulatory Visit (INDEPENDENT_AMBULATORY_CARE_PROVIDER_SITE_OTHER): Payer: BLUE CROSS/BLUE SHIELD | Admitting: Obstetrics and Gynecology

## 2018-01-29 VITALS — BP 116/85 | HR 110 | Ht 66.0 in | Wt 229.4 lb

## 2018-01-29 MED ORDER — CYANOCOBALAMIN 1000 MCG/ML IJ SOLN
1000.0000 ug | Freq: Once | INTRAMUSCULAR | Status: AC
  Administered 2018-01-29: 1000 ug via INTRAMUSCULAR

## 2018-01-29 NOTE — Progress Notes (Signed)
Pt is here for wt, bp check, b-12 inj She is doing very well, exercising and feeling great!!  01/29/18 wt- 229.4lb 01/01/18 wt- 236lb  Waist:42

## 2018-02-18 ENCOUNTER — Ambulatory Visit: Payer: BLUE CROSS/BLUE SHIELD | Admitting: Obstetrics and Gynecology

## 2018-02-19 ENCOUNTER — Other Ambulatory Visit: Payer: Self-pay | Admitting: Obstetrics and Gynecology

## 2018-02-19 MED ORDER — SERTRALINE HCL 50 MG PO TABS: 50.0000 mg | ORAL_TABLET | Freq: Every day | ORAL | 2 refills | Status: DC

## 2018-02-22 ENCOUNTER — Telehealth: Payer: BLUE CROSS/BLUE SHIELD | Admitting: Family

## 2018-02-22 DIAGNOSIS — K219 Gastro-esophageal reflux disease without esophagitis: Secondary | ICD-10-CM

## 2018-02-22 DIAGNOSIS — R1084 Generalized abdominal pain: Secondary | ICD-10-CM

## 2018-02-22 NOTE — Progress Notes (Signed)
Based on what you shared with me it looks like you have a serious condition that should be evaluated in a face to face office visit.  NOTE: If you entered your credit card information for this eVisit, you will not be charged. You may see a "hold" on your card for the $30 but that hold will drop off and you will not have a charge processed.  If you are having a true medical emergency please call 911.  If you need an urgent face to face visit, [facility] has four urgent care centers for your convenience.  If you need care fast and have a high deductible or no insurance consider:   [email or web address] to reserve your spot online an avoid wait times  [facility] [address], [address] [place], [place] [address] 8 am to 8 pm Monday-Friday 10 am to 4 pm Saturday-Sunday *Across the street from [employer]  [facility]  [address] [place], [address] 8 am to 5 pm Monday-Friday * In the [facility]   The following sites will take your  insurance:  . [facility]  [phone] Get Driving Directions Find a Provider at this Location  [address] , [place] [address] . 10 am to 8 pm Monday-Friday . 12 pm to 8 pm Saturday-Sunday   . [facility]  [phone] Get Driving Directions Find a Provider at this Location  [address], [address] , [place] [address] . 8 am to 8 pm Monday-Friday . 9 am to 6 pm Saturday . 11 am to 6 pm Sunday   . [facility] [place]  [phone] Get Driving Directions  [address] [place],  [address] . 8 am to 8 pm Monday-Friday . 8 am to 4 pm Saturday-Sunday   Your e-visit answers were reviewed by a board certified advanced clinical practitioner to complete your personal care plan.  Thank you for using e-Visits.  

## 2018-02-25 ENCOUNTER — Other Ambulatory Visit: Payer: Self-pay | Admitting: Obstetrics and Gynecology

## 2018-03-11 ENCOUNTER — Other Ambulatory Visit: Payer: Self-pay | Admitting: Obstetrics and Gynecology

## 2018-03-12 ENCOUNTER — Other Ambulatory Visit: Payer: Self-pay | Admitting: Obstetrics and Gynecology

## 2018-03-12 MED ORDER — SERTRALINE HCL 100 MG PO TABS: 50.0000 mg | ORAL_TABLET | Freq: Every day | ORAL | 6 refills | Status: DC

## 2018-03-12 MED ORDER — QUETIAPINE FUMARATE 25 MG PO TABS: 25.0000 mg | ORAL_TABLET | Freq: Every day | ORAL | 2 refills | Status: DC

## 2018-03-13 ENCOUNTER — Other Ambulatory Visit: Payer: Self-pay | Admitting: *Deleted

## 2018-03-13 MED ORDER — SERTRALINE HCL 100 MG PO TABS: 100.0000 mg | ORAL_TABLET | Freq: Every day | ORAL | 6 refills | Status: DC

## 2018-03-18 ENCOUNTER — Encounter: Payer: Self-pay | Admitting: Obstetrics and Gynecology

## 2018-03-18 ENCOUNTER — Ambulatory Visit (INDEPENDENT_AMBULATORY_CARE_PROVIDER_SITE_OTHER): Payer: BLUE CROSS/BLUE SHIELD | Admitting: Obstetrics and Gynecology

## 2018-03-18 VITALS — BP 120/84 | HR 98 | Ht 66.0 in | Wt 233.2 lb

## 2018-03-18 DIAGNOSIS — Z01419 Encounter for gynecological examination (general) (routine) without abnormal findings: Secondary | ICD-10-CM

## 2018-03-18 NOTE — Progress Notes (Signed)
Subjective:   Denise Gaines is a 42 y.o. G33P2002 Caucasian female here for a routine well-woman exam.  No LMP recorded. (Menstrual status: IUD).    Current complaints: seroquel working better, but still wakes up at 2 am on current dose. Also feels better on the zoloft.  PCP: none       does desire labs  Social History: Sexual: heterosexual Marital Status: married Living situation: with family Occupation: unknown occupation Tobacco/alcohol: no tobacco use Illicit drugs: no history of illicit drug use  The following portions of the patient's history were reviewed and updated as appropriate: allergies, current medications, past family history, past medical history, past social history, past surgical history and problem list.  Past Medical History Past Medical History:  Diagnosis Date  . Depression   . Obesity     Past Surgical History Past Surgical History:  Procedure Laterality Date  . BREAST BIOPSY Right 2017   NEG  . NASAL SINUS SURGERY  3557,3220    Gynecologic History G2P2002  No LMP recorded. (Menstrual status: IUD). Contraception: IUD Last Pap: 02/2016. Results were: normal Last mammogram: 04/2017. Results were: normal   Obstetric History OB History  Gravida Para Term Preterm AB Living  2 2 2     2   SAB TAB Ectopic Multiple Live Births               # Outcome Date GA Lbr Len/2nd Weight Sex Delivery Anes PTL Lv  2 Term           1 Term             Current Medications Current Outpatient Medications on File Prior to Visit  Medication Sig Dispense Refill  . ALPRAZolam (XANAX) 0.5 MG tablet TAKE 1 TABLET BY MOUTH TWICE DAILY AS NEEDED 60 tablet 3  . cyanocobalamin (,VITAMIN B-12,) 1000 MCG/ML injection Inject 1 mL (1,000 mcg total) into the muscle every 30 (thirty) days. 10 mL 1  . levonorgestrel (MIRENA) 20 MCG/24HR IUD 1 each by Intrauterine route once.    . phentermine (ADIPEX-P) 37.5 MG tablet Take 1 tablet (37.5 mg total) by mouth daily before breakfast.  30 tablet 2  . QUEtiapine (SEROQUEL) 25 MG tablet Take 1 tablet (25 mg total) by mouth at bedtime. 30 tablet 2  . sertraline (ZOLOFT) 100 MG tablet Take 1 tablet (100 mg total) by mouth daily. 30 tablet 6  . amoxicillin-clavulanate (AUGMENTIN) 875-125 MG tablet Take 1 tablet by mouth 2 (two) times daily. (Patient not taking: Reported on 01/29/2018) 14 tablet 0   No current facility-administered medications on file prior to visit.     Review of Systems Patient denies any headaches, blurred vision, shortness of breath, chest pain, abdominal pain, problems with bowel movements, urination, or intercourse.  Objective:  BP 120/84   Pulse 98   Ht 5\' 6"  (1.676 m)   Wt 233 lb 3.2 oz (105.8 kg)   BMI 37.64 kg/m  Physical Exam  General:  Well developed, well nourished, no acute distress. She is alert and oriented x3. Skin:  Warm and dry Neck:  Midline trachea, no thyromegaly or nodules Cardiovascular: Regular rate and rhythm, no murmur heard Lungs:  Effort normal, all lung fields clear to auscultation bilaterally Breasts:  No dominant palpable mass, retraction, or nipple discharge Abdomen:  Soft, non tender, no hepatosplenomegaly or masses Pelvic:  External genitalia is normal in appearance.  The vagina is normal in appearance. The cervix is bulbous, no CMT.  Thin prep pap is not  done. IUD string noted. Uterus is felt to be normal size, shape, and contour.  No adnexal masses or tenderness noted. Extremities:  No swelling or varicosities noted Psych:  She has a normal mood and affect  Assessment:   Healthy well-woman exam IUD check Anxiety with depression Obesity   Plan:  Labs obtained- will follow up accordingly Will increase seroquel to 50mg  nightly and let me know how it works.  F/U 1 year for AE, or sooner if needed Mammogram ordered  Nadira Single [place] Ghee, CNM

## 2018-03-18 NOTE — Patient Instructions (Signed)
Preventive Care 18-39 Years, Female Preventive care refers to lifestyle choices and visits with your health care provider that can promote health and wellness. What does preventive care include?  A yearly physical exam. This is also called an annual well check.  Dental exams once or twice a year.  Routine eye exams. Ask your health care provider how often you should have your eyes checked.  Personal lifestyle choices, including: ? Daily care of your teeth and gums. ? Regular physical activity. ? Eating a healthy diet. ? Avoiding tobacco and drug use. ? Limiting alcohol use. ? Practicing safe sex. ? Taking vitamin and mineral supplements as recommended by your health care provider. What happens during an annual well check? The services and screenings done by your health care provider during your annual well check will depend on your age, overall health, lifestyle risk factors, and family history of disease. Counseling Your health care provider may ask you questions about your:  Alcohol use.  Tobacco use.  Drug use.  Emotional well-being.  Home and relationship well-being.  Sexual activity.  Eating habits.  Work and work Statistician.  Method of birth control.  Menstrual cycle.  Pregnancy history.  Screening You may have the following tests or measurements:  Height, weight, and BMI.  Diabetes screening. This is done by checking your blood sugar (glucose) after you have not eaten for a while (fasting).  Blood pressure.  Lipid and cholesterol levels. These may be checked every 5 years starting at age 38.  Skin check.  Hepatitis C blood test.  Hepatitis B blood test.  Sexually transmitted disease (STD) testing.  BRCA-related cancer screening. This may be done if you have a family history of breast, ovarian, tubal, or peritoneal cancers.  Pelvic exam and Pap test. This may be done every 3 years starting at age 38. Starting at age 30, this may be done  every 5 years if you have a Pap test in combination with an HPV test.  Discuss your test results, treatment options, and if necessary, the need for more tests with your health care provider. Vaccines Your health care provider may recommend certain vaccines, such as:  Influenza vaccine. This is recommended every year.  Tetanus, diphtheria, and acellular pertussis (Tdap, Td) vaccine. You may need a Td booster every 10 years.  Varicella vaccine. You may need this if you have not been vaccinated.  HPV vaccine. If you are 39 or younger, you may need three doses over 6 months.  Measles, mumps, and rubella (MMR) vaccine. You may need at least one dose of MMR. You may also need a second dose.  Pneumococcal 13-valent conjugate (PCV13) vaccine. You may need this if you have certain conditions and were not previously vaccinated.  Pneumococcal polysaccharide (PPSV23) vaccine. You may need one or two doses if you smoke cigarettes or if you have certain conditions.  Meningococcal vaccine. One dose is recommended if you are age 68-21 years and a first-year college student living in a residence hall, or if you have one of several medical conditions. You may also need additional booster doses.  Hepatitis A vaccine. You may need this if you have certain conditions or if you travel or work in places where you may be exposed to hepatitis A.  Hepatitis B vaccine. You may need this if you have certain conditions or if you travel or work in places where you may be exposed to hepatitis B.  Haemophilus influenzae type b (Hib) vaccine. You may need this  if you have certain risk factors.  Talk to your health care provider about which screenings and vaccines you need and how often you need them. This information is not intended to replace advice given to you by your health care provider. Make sure you discuss any questions you have with your health care provider. Document Released: 06/19/2001 Document Revised:  01/11/2016 Document Reviewed: 02/22/2015 Elsevier Interactive Patient Education  2018 Elsevier Inc.  

## 2018-03-19 LAB — COMPREHENSIVE METABOLIC PANEL
ALBUMIN: 3.8 g/dL (ref 3.5–5.5)
ALK PHOS: 101 IU/L (ref 39–117)
ALT: 16 IU/L (ref 0–32)
AST: 19 IU/L (ref 0–40)
Albumin/Globulin Ratio: 1.8 (ref 1.2–2.2)
BUN / CREAT RATIO: 14 (ref 9–23)
BUN: 10 mg/dL (ref 6–24)
Bilirubin Total: 0.3 mg/dL (ref 0.0–1.2)
CO2: 20 mmol/L (ref 20–29)
CREATININE: 0.71 mg/dL (ref 0.57–1.00)
Calcium: 8.9 mg/dL (ref 8.7–10.2)
Chloride: 104 mmol/L (ref 96–106)
GFR calc non Af Amer: 105 mL/min/{1.73_m2} (ref >59)
GFR, EST AFRICAN AMERICAN: 121 mL/min/{1.73_m2} (ref >59)
GLOBULIN, TOTAL: 2.1 g/dL (ref 1.5–4.5)
Glucose: 83 mg/dL (ref 65–99)
Potassium: 4.2 mmol/L (ref 3.5–5.2)
SODIUM: 137 mmol/L (ref 134–144)
TOTAL PROTEIN: 5.9 g/dL — AB (ref 6.0–8.5)

## 2018-03-19 LAB — LIPID PANEL
CHOLESTEROL TOTAL: 158 mg/dL (ref 100–199)
Chol/HDL Ratio: 3.4 ratio (ref 0.0–4.4)
HDL: 47 mg/dL (ref >39)
LDL Calculated: 88 mg/dL (ref 0–99)
Triglycerides: 116 mg/dL (ref 0–149)
VLDL CHOLESTEROL CAL: 23 mg/dL (ref 5–40)

## 2018-03-19 LAB — THYROID PANEL WITH TSH
FREE THYROXINE INDEX: 1.3 (ref 1.2–4.9)
T3 Uptake Ratio: 22 % — ABNORMAL LOW (ref 24–39)
T4, Total: 5.8 ug/dL (ref 4.5–12.0)
TSH: 1.27 u[IU]/mL (ref 0.450–4.500)

## 2018-03-19 LAB — HEMOGLOBIN A1C
ESTIMATED AVERAGE GLUCOSE: 100 mg/dL
Hgb A1c MFr Bld: 5.1 % (ref 4.8–5.6)

## 2018-03-19 LAB — VITAMIN D 25 HYDROXY (VIT D DEFICIENCY, FRACTURES): Vit D, 25-Hydroxy: 40.4 ng/mL (ref 30.0–100.0)

## 2018-03-25 ENCOUNTER — Other Ambulatory Visit: Payer: Self-pay | Admitting: *Deleted

## 2018-03-25 MED ORDER — QUETIAPINE FUMARATE 50 MG PO TABS: 50.0000 mg | ORAL_TABLET | Freq: Every day | ORAL | 2 refills | Status: DC

## 2018-04-22 ENCOUNTER — Ambulatory Visit
Admission: RE | Admit: 2018-04-22 | Discharge: 2018-04-22 | Disposition: A | Discharge status: Home / Self Care | Payer: BLUE CROSS/BLUE SHIELD | Source: Ambulatory Visit | Attending: Obstetrics and Gynecology | Admitting: Obstetrics and Gynecology

## 2018-04-22 DIAGNOSIS — Z01419 Encounter for gynecological examination (general) (routine) without abnormal findings: Secondary | ICD-10-CM | POA: Diagnosis not present

## 2018-04-23 ENCOUNTER — Telehealth: Payer: BLUE CROSS/BLUE SHIELD | Admitting: Nurse Practitioner

## 2018-04-23 DIAGNOSIS — J0100 Acute maxillary sinusitis, unspecified: Secondary | ICD-10-CM

## 2018-04-23 MED ORDER — AMOXICILLIN-POT CLAVULANATE 875-125 MG PO TABS: 1.0000 | ORAL_TABLET | Freq: Two times a day (BID) | ORAL | 0 refills | Status: DC

## 2018-04-23 MED FILL — AMOX-CLAV 875-125 MG TABLET: 875-125 | 7 days supply | Qty: 14 | Fill #0

## 2018-04-23 NOTE — Progress Notes (Signed)
We are sorry that you are not feeling well.  Here is how we plan to help!  Based on what you have shared with me it looks like you have sinusitis.  Sinusitis is inflammation and infection in the sinus cavities of the head.  Based on your presentation I believe you most likely have Acute Bacterial Sinusitis.  This is an infection caused by bacteria and is treated with antibiotics. I have prescribed Augmentin 875mg /125mg  one tablet twice daily with food, for 7 days. You may use an oral decongestant such as Mucinex D or if you have glaucoma or high blood pressure use plain Mucinex. Saline nasal spray help and can safely be used as often as needed for congestion.  If you develop worsening sinus pain, fever or notice severe headache and vision changes, or if symptoms are not better after completion of antibiotic, please schedule an appointment with a health care provider.    * antibiotic was given because had worsened the last 3 days   Sinus infections are not as easily transmitted as other respiratory infection, however we still recommend that you avoid close contact with loved ones, especially the very young and elderly.  Remember to wash your hands thoroughly throughout the day as this is the number one way to prevent the spread of infection!  Home Care:  Only take medications as instructed by your medical team.  Complete the entire course of an antibiotic.  Do not take these medications with alcohol.  A steam or ultrasonic humidifier can help congestion.  You can place a towel over your head and breathe in the steam from hot water coming from a faucet.  Avoid close contacts especially the very young and the elderly.  Cover your mouth when you cough or sneeze.  Always remember to wash your hands.  Get Help Right Away If:  You develop worsening fever or sinus pain.  You develop a severe head ache or visual changes.  Your symptoms persist after you have completed your treatment  plan.  Make sure you  Understand these instructions.  Will watch your condition.  Will get help right away if you are not doing well or get worse.  Your e-visit answers were reviewed by a board certified advanced clinical practitioner to complete your personal care plan.  Depending on the condition, your plan could have included both over the counter or prescription medications.  If there is a problem please reply  once you have received a response from your provider.  Your safety is important to [place].  If you have drug allergies check your prescription carefully.    You can use MyChart to ask questions about today's visit, request a non-urgent call back, or ask for a work or school excuse for 24 hours related to this e-Visit. If it has been greater than 24 hours you will need to follow up with your provider, or enter a new e-Visit to address those concerns.  You will get an e-mail in the next two days asking about your experience.  I hope that your e-visit has been valuable and will speed your recovery. Thank you for using e-visits.

## 2018-05-17 ENCOUNTER — Telehealth: Payer: BLUE CROSS/BLUE SHIELD | Admitting: Nurse Practitioner

## 2018-05-17 DIAGNOSIS — J0101 Acute recurrent maxillary sinusitis: Secondary | ICD-10-CM

## 2018-05-17 MED ORDER — AMOXICILLIN-POT CLAVULANATE 875-125 MG PO TABS: 1.0000 | ORAL_TABLET | Freq: Two times a day (BID) | ORAL | 0 refills | Status: DC

## 2018-05-17 NOTE — Progress Notes (Signed)

## 2018-06-18 ENCOUNTER — Other Ambulatory Visit: Payer: Self-pay | Admitting: Obstetrics and Gynecology

## 2018-06-26 ENCOUNTER — Other Ambulatory Visit: Payer: Self-pay | Admitting: *Deleted

## 2018-06-26 MED ORDER — ALPRAZOLAM 0.5 MG PO TABS: 0.5000 mg | ORAL_TABLET | Freq: Two times a day (BID) | ORAL | 3 refills | Status: DC | PRN

## 2018-10-16 ENCOUNTER — Telehealth: Payer: Self-pay | Admitting: *Deleted

## 2018-10-16 MED ORDER — TERCONAZOLE 0.4 % VA CREA: 1.0000 | TOPICAL_CREAM | Freq: Every day | VAGINAL | 0 refills | Status: DC

## 2018-10-16 MED ORDER — FLUCONAZOLE 150 MG PO TABS: 150.0000 mg | ORAL_TABLET | Freq: Once | ORAL | 2 refills | Status: AC

## 2018-10-16 NOTE — Telephone Encounter (Signed)
Pt called needing medication for yeast inf

## 2018-10-22 ENCOUNTER — Other Ambulatory Visit: Payer: Self-pay | Admitting: Obstetrics and Gynecology

## 2018-11-14 ENCOUNTER — Other Ambulatory Visit: Payer: Self-pay | Admitting: Obstetrics and Gynecology

## 2018-11-21 ENCOUNTER — Other Ambulatory Visit: Payer: Self-pay | Admitting: *Deleted

## 2018-11-21 MED ORDER — SERTRALINE HCL 100 MG PO TABS: 100.0000 mg | ORAL_TABLET | Freq: Every day | ORAL | 6 refills | Status: DC

## 2018-12-30 ENCOUNTER — Telehealth: Payer: BLUE CROSS/BLUE SHIELD | Admitting: Nurse Practitioner

## 2018-12-30 DIAGNOSIS — J0101 Acute recurrent maxillary sinusitis: Secondary | ICD-10-CM

## 2018-12-30 MED ORDER — AMOXICILLIN-POT CLAVULANATE 875-125 MG PO TABS: 1.0000 | ORAL_TABLET | Freq: Two times a day (BID) | ORAL | 0 refills | Status: DC

## 2018-12-30 NOTE — Progress Notes (Signed)

## 2019-01-13 ENCOUNTER — Other Ambulatory Visit: Payer: Self-pay | Admitting: Obstetrics and Gynecology

## 2019-03-27 ENCOUNTER — Telehealth: Payer: Self-pay

## 2019-03-27 NOTE — Telephone Encounter (Signed)
Pt called to inquire about scheduling her mammogram. Informed pt she needs her annual physical as well. Last physical was 03/18/18 with [facility]. Pt states she will review provider options then contact for appt.

## 2019-03-31 NOTE — Telephone Encounter (Signed)
Pt request order for mammogram that is due at [facility] after 04/23/19 per pt.

## 2019-04-07 ENCOUNTER — Other Ambulatory Visit: Payer: Self-pay

## 2019-04-07 DIAGNOSIS — Z1231 Encounter for screening mammogram for malignant neoplasm of breast: Secondary | ICD-10-CM

## 2019-04-09 ENCOUNTER — Other Ambulatory Visit: Payer: Self-pay

## 2019-04-09 MED ORDER — QUETIAPINE FUMARATE 50 MG PO TABS: ORAL_TABLET | ORAL | 0 refills | Status: DC

## 2019-04-21 ENCOUNTER — Other Ambulatory Visit: Payer: Self-pay

## 2019-04-21 ENCOUNTER — Ambulatory Visit (INDEPENDENT_AMBULATORY_CARE_PROVIDER_SITE_OTHER): Payer: BC Managed Care – PPO | Admitting: Certified Nurse Midwife

## 2019-04-21 ENCOUNTER — Encounter: Payer: Self-pay | Admitting: Certified Nurse Midwife

## 2019-04-21 VITALS — BP 118/72 | HR 79 | Ht 66.0 in | Wt 262.2 lb

## 2019-04-21 DIAGNOSIS — Z79899 Other long term (current) drug therapy: Secondary | ICD-10-CM

## 2019-04-21 NOTE — Progress Notes (Signed)
  Medication Management Clinic Visit Note  Patient: Denise Gaines MRN: [id number] Date of Birth: 1976-03-08 PCP: Patient, No Pcp Per   Lylith Makki 43 y.o. female presents today for discussion on medication management. She state she has struggled with depression for 20 yrs. She states that she has never seen a specialist. She admits to being treated with Effexor, Zoloft Wellbutrin in the past. She states recently she has done research regarding the addition of a stimulant to help her with motivation. She states she has difficult being motivated to do task like brush her teeth or put on her makeup. She has developed coping mechanism that have helped. She state she has had to negotiate with herself to get tasks done. She is interested in medication that may help her with motivation.   BP 118/72   Pulse 79   Ht 5\' 6"  (1.676 m)   Wt 262 lb 4 oz (119 kg)   BMI 42.33 kg/m   Patient Information   Past Medical History:  Diagnosis Date  . Depression   . Obesity       Past Surgical History:  Procedure Laterality Date  . BREAST BIOPSY Right 2017   NEG  . NASAL SINUS SURGERY  [phone]     Family History  Problem Relation Age of Onset  . Breast cancer Neg Hx     Social History   Substance and Sexual Activity  Alcohol Use No      Social History   Tobacco Use  Smoking Status Never Smoker  Smokeless Tobacco Never Used      Health Maintenance  Topic Date Due  . PAP SMEAR-Modifier  03/08/2019  . HIV Screening  04/20/2020 (Originally 06/12/1989)  . TETANUS/TDAP  12/09/2026  . INFLUENZA VACCINE  Completed     Assessment and Plan:  Recommend referral to behavioral health, explained to pt that it is outside my scope of practice and that I am unfamiliar with these medications in addition to current meds. Given her history and trial of multiple medications for treatment pt encouraged to see behavioral health specialist. She states she is going to ask around to find recommendation  of a psychiatrists. She verbalizes appreciation for honesty and states she will follow up as recommended.   Philip Aspen, CNM

## 2019-04-21 NOTE — Patient Instructions (Signed)
Persistent Depressive Disorder  Persistent depressive disorder (PDD) is a mental health condition. PDD causes symptoms of low-level depression for 2 years or longer. It may also be called long-term (chronic) depression or dysthymia. PDD may include episodes of more severe depression that last for about 2 weeks (major depressive disorder or MDD). PDD can affect the way you think, feel, and sleep. This condition may also affect your relationships. You may be more likely to get sick if you have PDD. Symptoms of PDD occur for most of the day and may include:  Feeling tired (fatigue).  Low energy.  Eating too much or too little.  Sleeping too much or too little.  Feeling restless or agitated.  Feeling hopeless.  Feeling worthless or guilty.  Feeling worried or nervous (anxiety).  Trouble concentrating or making decisions.  Low self-esteem.  A negative way of looking at things (outlook).  Not being able to have fun or feel pleasure.  Avoiding interacting with people.  Getting angry or annoyed easily (irritability).  Acting aggressive or angry. Follow these instructions at home: Activity  Go back to your normal activities as told by your doctor.  Exercise regularly as told by your doctor. General instructions  Take over-the-counter and prescription medicines only as told by your doctor.  Do not drink alcohol. Or, limit how much alcohol you drink to no more than 1 drink a day for nonpregnant women and 2 drinks a day for men. One drink equals 12 oz of beer, 5 oz of wine, or 1 oz of hard liquor. Alcohol can affect any antidepressant medicines you are taking. Talk with your doctor about your alcohol use.  Eat a healthy diet and get plenty of sleep.  Find activities that you enjoy each day.  Consider joining a support group. Your doctor may be able to suggest a support group.  Keep all follow-up visits as told by your doctor. This is important. Where to find more  information [employer] on Mental Illness  www.nami.org U.S. National Institute of Mental Health  https://carter.com/ National Suicide Prevention Lifeline  367-351-7996).  This is free, 24-hour help. Contact a doctor if:  Your symptoms get worse.  You have new symptoms.  You have trouble sleeping or doing your daily activities. Get help right away if:  You self-harm.  You have serious thoughts about hurting yourself or others.  You see, hear, taste, smell, or feel things that are not there (hallucinate). This information is not intended to replace advice given to you by your health care provider. Make sure you discuss any questions you have with your health care provider. Document Released: 04/04/2015 Document Revised: 04/05/2017 Document Reviewed: 12/16/2015 Elsevier Patient Education  2020 [employer].

## 2019-04-22 ENCOUNTER — Encounter: Payer: Self-pay | Admitting: Certified Nurse Midwife

## 2019-05-06 ENCOUNTER — Other Ambulatory Visit: Payer: Self-pay | Admitting: Certified Nurse Midwife

## 2019-05-06 ENCOUNTER — Other Ambulatory Visit: Payer: Self-pay

## 2019-05-06 MED ORDER — QUETIAPINE FUMARATE 50 MG PO TABS: ORAL_TABLET | ORAL | 0 refills | Status: DC

## 2019-05-19 ENCOUNTER — Other Ambulatory Visit: Payer: Self-pay

## 2019-05-19 ENCOUNTER — Encounter: Payer: Self-pay | Admitting: Certified Nurse Midwife

## 2019-05-19 ENCOUNTER — Ambulatory Visit (INDEPENDENT_AMBULATORY_CARE_PROVIDER_SITE_OTHER): Payer: 59 | Admitting: Certified Nurse Midwife

## 2019-05-19 VITALS — BP 100/70 | HR 78 | Ht 66.0 in | Wt 270.1 lb

## 2019-05-19 DIAGNOSIS — Z1231 Encounter for screening mammogram for malignant neoplasm of breast: Secondary | ICD-10-CM | POA: Diagnosis not present

## 2019-05-19 DIAGNOSIS — Z01419 Encounter for gynecological examination (general) (routine) without abnormal findings: Secondary | ICD-10-CM

## 2019-05-19 MED ORDER — QUETIAPINE FUMARATE 50 MG PO TABS: ORAL_TABLET | ORAL | 0 refills | Status: DC

## 2019-05-19 MED ORDER — SERTRALINE HCL 100 MG PO TABS: 100.0000 mg | ORAL_TABLET | Freq: Every day | ORAL | 1 refills | Status: DC

## 2019-05-19 MED ORDER — ALPRAZOLAM 0.5 MG PO TABS: 0.5000 mg | ORAL_TABLET | Freq: Two times a day (BID) | ORAL | 0 refills | Status: AC | PRN

## 2019-05-19 MED ORDER — ALPRAZOLAM 0.5 MG PO TABS: 0.5000 mg | ORAL_TABLET | Freq: Two times a day (BID) | ORAL | 0 refills | Status: DC | PRN

## 2019-05-19 NOTE — Progress Notes (Signed)
GYNECOLOGY ANNUAL PREVENTATIVE CARE ENCOUNTER NOTE  History:     Denise Gaines is a 44 y.o. G33P2002 female here for a routine annual gynecologic exam.  Current complaints: Weight gain 30 lbs this past year due to anxiety/depression.   Denies abnormal vaginal bleeding, discharge, pelvic pain, problems with intercourse or other gynecologic concerns.     Social History: Sexual: heterosexual Marital Status: married  ( 2 children 16yr/16 yr old) Living situation: with family Occupation: unknown occupation Tobacco/alcohol: no tobacco use Illicit drugs: no history of illicit drug use   Gynecologic History No LMP recorded. (Menstrual status: IUD). Contraception: IUD Last Pap: 03/08/2016. Results were: normal with negative HPV Last mammogram: 04/22/2018  Results were: normal  Obstetric History OB History  Gravida Para Term Preterm AB Living  2 2 2     2   SAB TAB Ectopic Multiple Live Births          2    # Outcome Date GA Lbr Len/2nd Weight Sex Delivery Anes PTL Lv  2 Term 10/07/07   8 lb (3.629 kg) M Vag-Spont  N LIV  1 Term 04/02/03   8 lb (3.629 kg) M Vag-Spont  N LIV    Past Medical History:  Diagnosis Date  . Depression   . Obesity     Past Surgical History:  Procedure Laterality Date  . BREAST BIOPSY Right 2017   NEG  . NASAL SINUS SURGERY  [phone]    Current Outpatient Medications on File Prior to Visit  Medication Sig Dispense Refill  . ALPRAZolam (XANAX) 0.5 MG tablet TAKE 1 TABLET BY MOUTH TWICE DAILY AS NEEDED 60 tablet 3  . levonorgestrel (MIRENA) 20 MCG/24HR IUD 1 each by Intrauterine route once.    Marland Kitchen QUEtiapine (SEROQUEL) 50 MG tablet TAKE 1 TABLET(50 MG) BY MOUTH AT BEDTIME 30 tablet 0  . sertraline (ZOLOFT) 100 MG tablet Take 1 tablet (100 mg total) by mouth daily. 30 tablet 6  . amoxicillin-clavulanate (AUGMENTIN) 875-125 MG tablet Take 1 tablet by mouth 2 (two) times daily. 14 tablet 0  . cyanocobalamin (,VITAMIN B-12,) 1000 MCG/ML injection Inject 1  mL (1,000 mcg total) into the muscle every 30 (thirty) days. 10 mL 1  . phentermine (ADIPEX-P) 37.5 MG tablet Take 1 tablet (37.5 mg total) by mouth daily before breakfast. 30 tablet 2  . terconazole (TERAZOL 7) 0.4 % vaginal cream Place 1 applicator vaginally at bedtime. 45 g 0   No current facility-administered medications on file prior to visit.    No Known Allergies  Social History:  reports that she has never smoked. She has never used smokeless tobacco. She reports that she does not drink alcohol or use drugs.  Family History  Problem Relation Age of Onset  . Breast cancer Neg Hx     The following portions of the patient's history were reviewed and updated as appropriate: allergies, current medications, past family history, past medical history, past social history, past surgical history and problem list.  Review of Systems Pertinent items noted in HPI and remainder of comprehensive ROS otherwise negative.  Physical Exam:  BP 100/70   Pulse 78   Ht 5\' 6"  (1.676 m)   Wt 270 lb 1 oz (122.5 kg)   BMI 43.59 kg/m  CONSTITUTIONAL: Well-developed, well-nourished female, obese in no acute distress.  HENT:  Normocephalic, atraumatic, External right and left ear normal. Oropharynx is clear and moist EYES: Conjunctivae and EOM are normal. Pupils are equal, round, and reactive to light. No  scleral icterus.  NECK: Normal range of motion, supple, no masses.  Normal thyroid.  SKIN: Skin is warm and dry. No rash noted. Not diaphoretic. No erythema. No pallor. MUSCULOSKELETAL: Normal range of motion. No tenderness.  No cyanosis, clubbing, or edema.  2+ distal pulses. NEUROLOGIC: Alert and oriented to person, place, and time. Normal reflexes, muscle tone coordination.  PSYCHIATRIC: Normal mood and affect. Normal behavior. Normal judgment and thought content. CARDIOVASCULAR: Normal heart rate noted, regular rhythm RESPIRATORY: Clear to auscultation bilaterally. Effort and breath sounds  normal, no problems with respiration noted. BREASTS: Symmetric in size. No masses, tenderness, skin changes, nipple drainage, or lymphadenopathy bilaterally. ABDOMEN: Soft, no distention noted.  No tenderness, rebound or guarding.  PELVIC: Normal appearing external genitalia and urethral meatus; normal appearing vaginal mucosa and cervix.  No abnormal discharge noted.  Pap smear not indicate IUD string present  Normal uterine size, no other palpable masses, no uterine or adnexal tenderness.   Assessment and Plan:    1. Women's annual routine gynecological examination  Pap smear due 2022 Mammogram ordered Labs; None due Refills: xanax, Zoloft, Seroquel Pt scheduling appointment with Dr.Kupar this month.  preventative health maintenance measures emphasized. Encouraged exercise to improve mood.  Please refer to After Visit Summary for other counseling recommendations.     Philip Aspen, CNM

## 2019-05-19 NOTE — Patient Instructions (Signed)

## 2019-06-08 ENCOUNTER — Ambulatory Visit
Admission: RE | Admit: 2019-06-08 | Discharge: 2019-06-08 | Disposition: A | Discharge status: Home / Self Care | Payer: 59 | Source: Ambulatory Visit | Attending: Certified Nurse Midwife | Admitting: Certified Nurse Midwife

## 2019-06-08 DIAGNOSIS — Z1231 Encounter for screening mammogram for malignant neoplasm of breast: Secondary | ICD-10-CM | POA: Insufficient documentation

## 2019-06-08 DIAGNOSIS — Z01419 Encounter for gynecological examination (general) (routine) without abnormal findings: Secondary | ICD-10-CM | POA: Diagnosis not present

## 2019-06-09 IMAGING — MG DIGITAL SCREENING BILATERAL MAMMOGRAM WITH TOMO AND CAD
6 of 10 series · 6 of 30 positions shown · non-contrast
Comparison: Previous exam(s).

CLINICAL DATA: Screening.

EXAM:
DIGITAL SCREENING BILATERAL MAMMOGRAM WITH TOMO AND CAD

[R CC synth-2D]
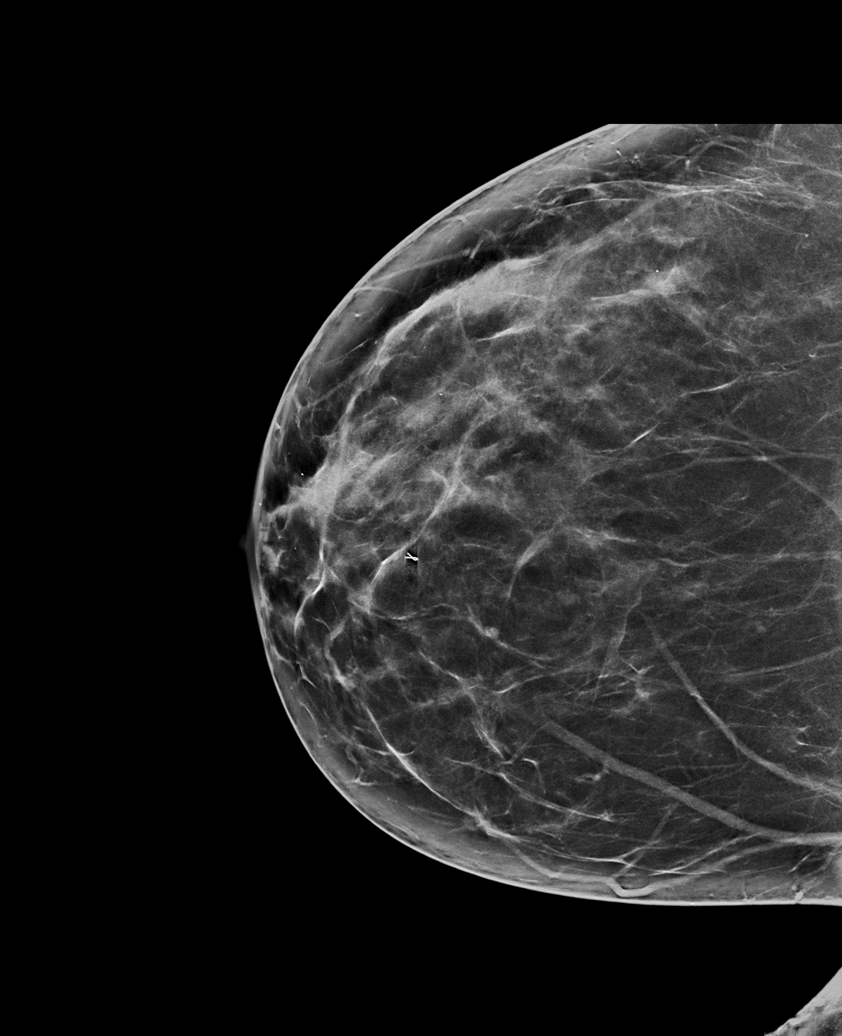

[R MLO synth-2D]
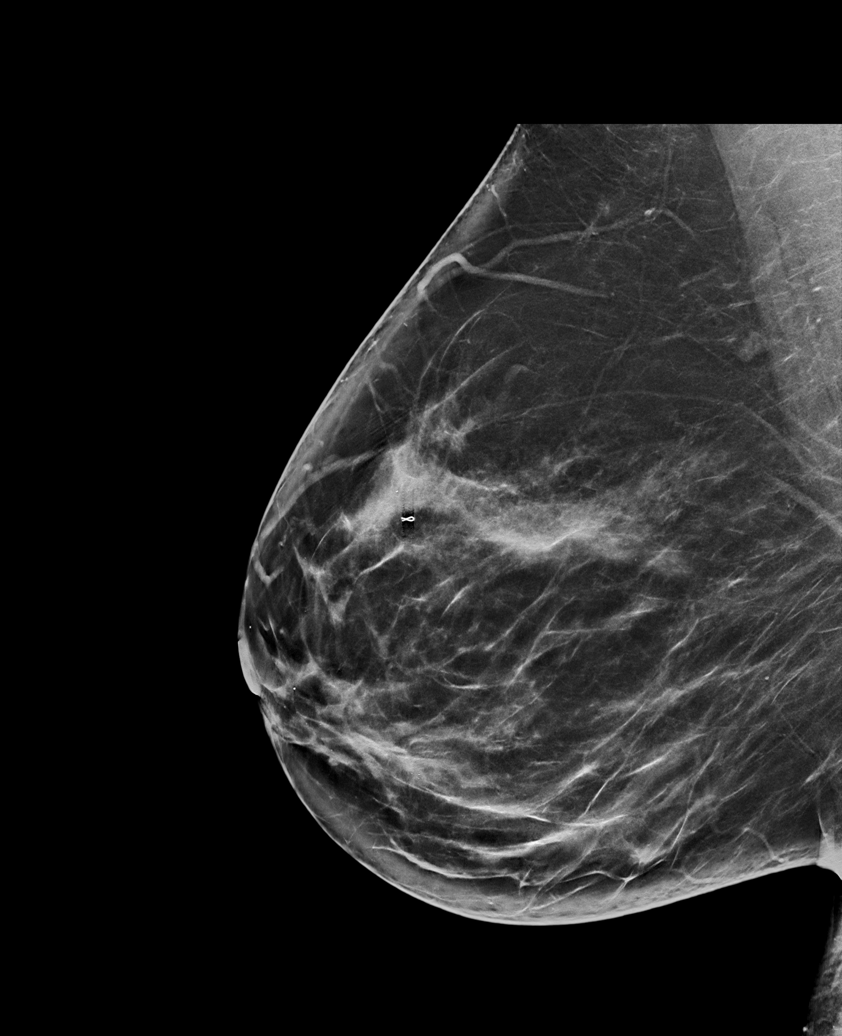

[L MLO synth-2D (1 of 2)]
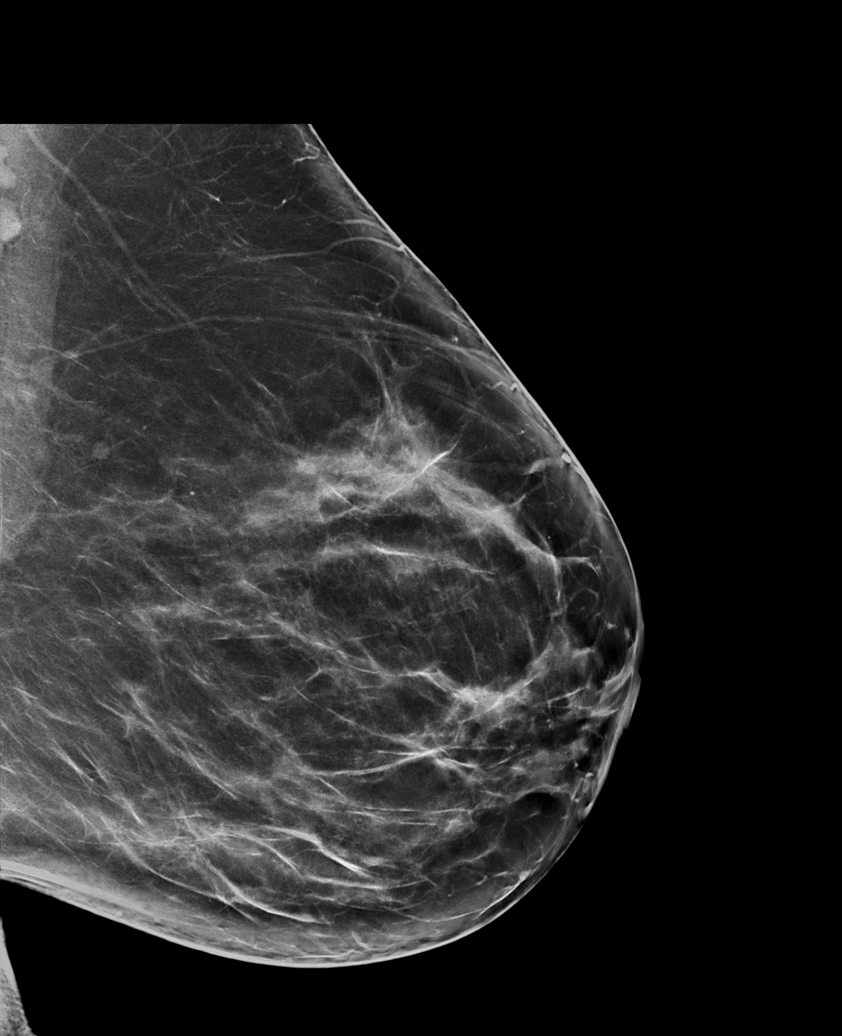

[L CC synth-2D]
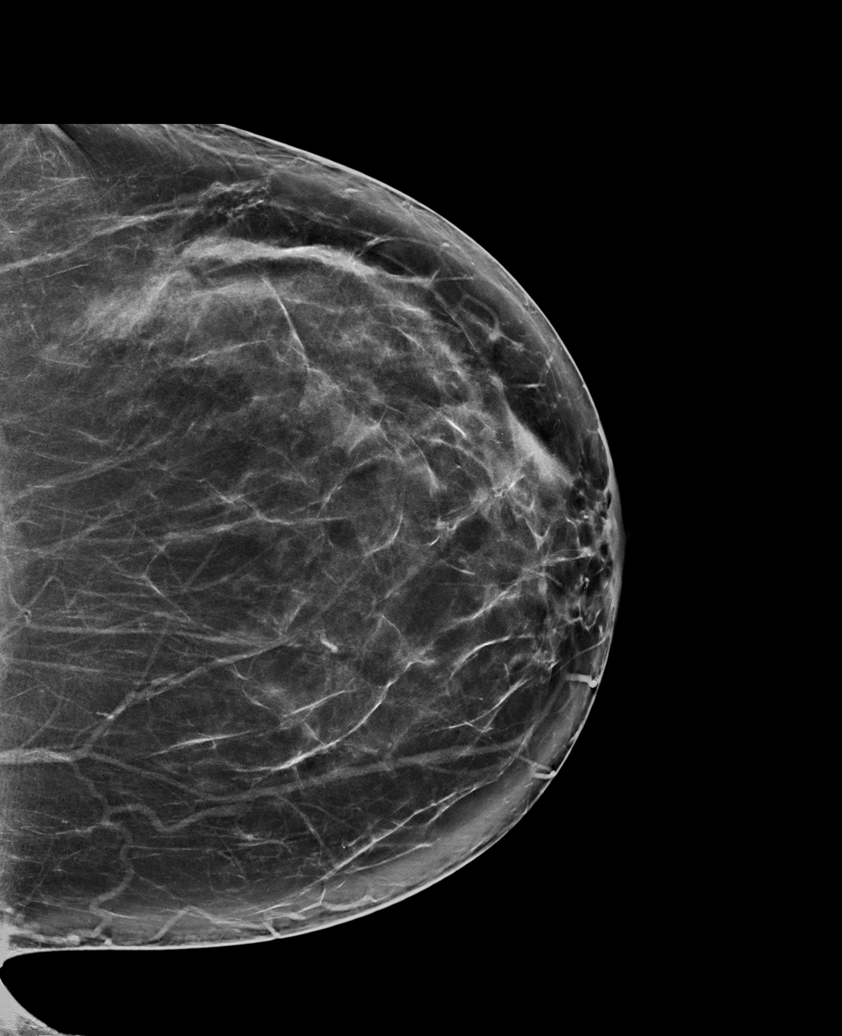

[L MLO synth-2D (2 of 2)]
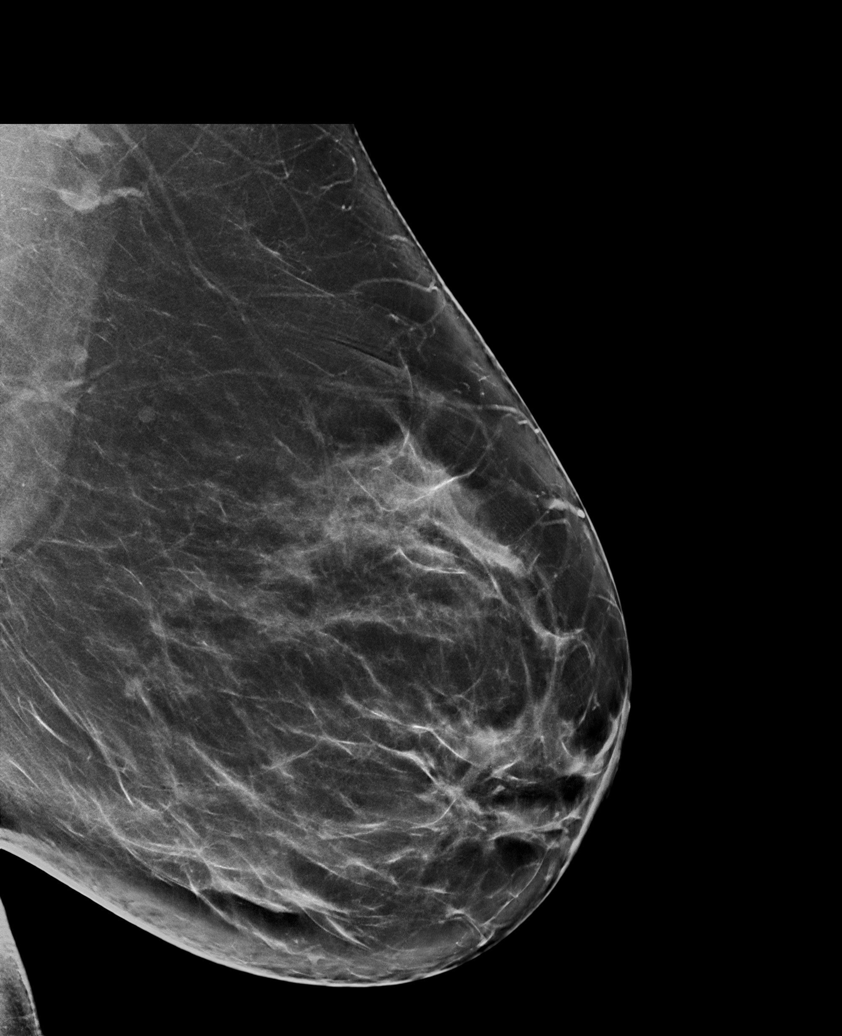

[R MLO tomo · tomo slice 45/88.0]
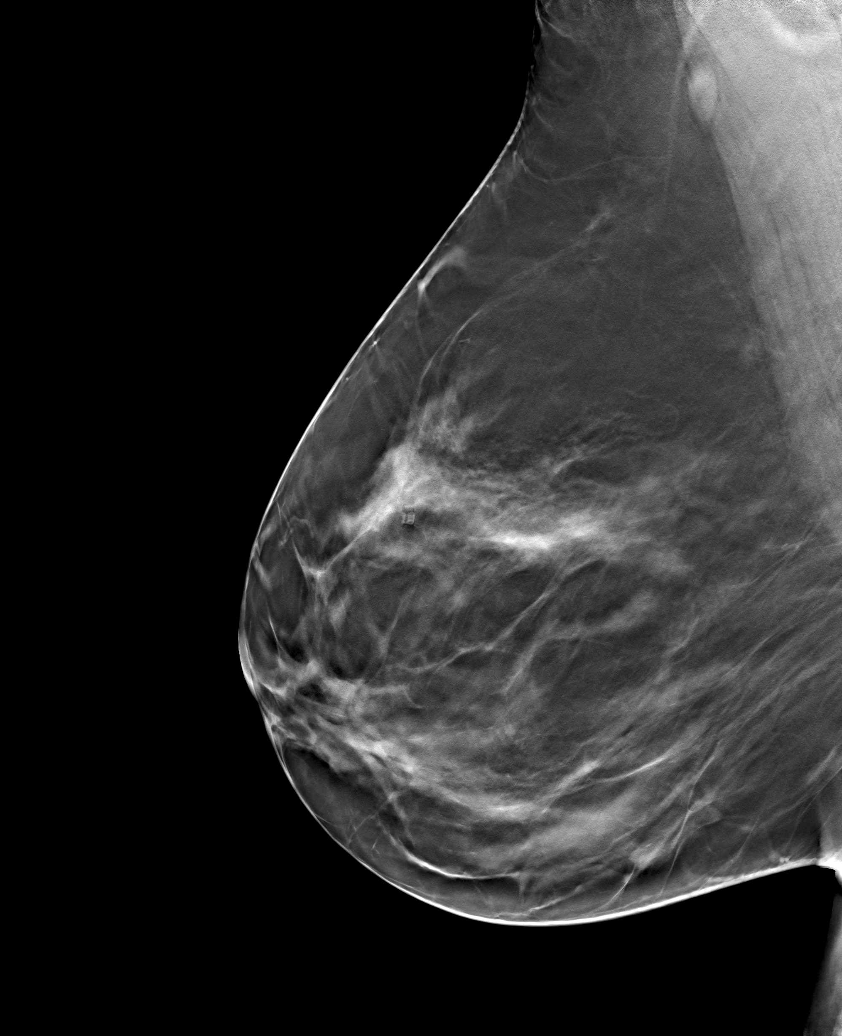

[6 of 30 positions shown; findings below may reference images not displayed]

ACR Breast Density Category c: The breast tissue is heterogeneously
dense, which may obscure small masses.
FINDINGS: There are no findings suspicious for malignancy. Images were
processed with CAD.
IMPRESSION: No mammographic evidence of malignancy. A result letter of this
screening mammogram will be mailed directly to the patient.

RECOMMENDATION:
Screening mammogram in one year. (Code:[id number]-U-LHB)

BI-RADS CATEGORY  1: Negative.

## 2019-07-16 ENCOUNTER — Other Ambulatory Visit: Payer: Self-pay

## 2019-07-16 MED ORDER — SERTRALINE HCL 100 MG PO TABS: 100.0000 mg | ORAL_TABLET | Freq: Every day | ORAL | 1 refills | Status: DC

## 2019-07-26 ENCOUNTER — Encounter: Payer: Self-pay | Admitting: Family

## 2019-07-27 ENCOUNTER — Telehealth (INDEPENDENT_AMBULATORY_CARE_PROVIDER_SITE_OTHER): Payer: 59 | Admitting: Family

## 2019-07-27 ENCOUNTER — Telehealth: Payer: Self-pay | Admitting: General Practice

## 2019-07-27 ENCOUNTER — Other Ambulatory Visit: Payer: Self-pay

## 2019-07-27 ENCOUNTER — Encounter: Payer: Self-pay | Admitting: Family

## 2019-07-27 DIAGNOSIS — K5900 Constipation, unspecified: Secondary | ICD-10-CM | POA: Diagnosis not present

## 2019-07-27 DIAGNOSIS — F329 Major depressive disorder, single episode, unspecified: Secondary | ICD-10-CM | POA: Diagnosis not present

## 2019-07-27 DIAGNOSIS — F32A Depression, unspecified: Secondary | ICD-10-CM

## 2019-07-27 NOTE — Assessment & Plan Note (Signed)
Improved greatly since has established with Dr Nicolasa Ducking. Will follow.

## 2019-07-27 NOTE — Progress Notes (Signed)
Virtual Visit via Video Note  I connected with@  on 07/28/19 at 10:00 AM EDT by a video enabled telemedicine application and verified that I am speaking with the correct person using two identifiers.  Location patient: home Location provider:work Persons participating in the virtual visit: patient, provider  I discussed the limitations of evaluation and management by telemedicine and the availability of in person appointments. The patient expressed understanding and agreed to proceed.   HPI: Primary had been with Melody CMN, now with Philip Aspen CNM.  Has been seeing Dr Nicolasa Ducking for 6-8 weeks for depression, fatigue. Recently added wellbutrin, trazodone and 'doing extremely well.'  Doing counselor Albin Felling, weekly at this time.   Struggles with constipation for years. BM once per week. Hard, straining. Has hemorrhoids. Tried senna without relief. Stool is brown in color. BRB on toilet paper, not in stool.  Takes miralax everyday, colace. No real result with metamucil.  Increased fiber, water. In the past with healthier diet, would have more regular BM.  No fever, diarrhea, abdominal pain. No unusual weight loss.    ROS: See pertinent positives and negatives per HPI.  Past Medical History:  Diagnosis Date  . Depression   . Obesity     Past Surgical History:  Procedure Laterality Date  . BREAST BIOPSY Right 2017   NEG  . NASAL SINUS SURGERY  [phone]    Family History  Problem Relation Age of Onset  . Mental illness Mother   . Breast cancer Neg Hx       Current Outpatient Medications:  .  buPROPion (WELLBUTRIN SR) 100 MG 12 hr tablet, Take 100 mg by mouth 2 (two) times daily., Disp: , Rfl:  .  levonorgestrel (MIRENA) 20 MCG/24HR IUD, 1 each by Intrauterine route once., Disp: , Rfl:  .  sertraline (ZOLOFT) 100 MG tablet, Take 1 tablet (100 mg total) by mouth daily. (Patient taking differently: Take 150 mg by mouth daily. ), Disp: 30 tablet, Rfl: 1 .  traZODone  (DESYREL) 100 MG tablet, Take 200 mg by mouth at bedtime., Disp: , Rfl:  .  cyanocobalamin (,VITAMIN B-12,) 1000 MCG/ML injection, Inject 1 mL (1,000 mcg total) into the muscle every 30 (thirty) days., Disp: 10 mL, Rfl: 1  EXAM:  VITALS per patient if applicable:  GENERAL: alert, oriented, appears well and in no acute distress  HEENT: atraumatic, conjunttiva clear, no obvious abnormalities on inspection of external nose and ears  NECK: normal movements of the head and neck  LUNGS: on inspection no signs of respiratory distress, breathing rate appears normal, no obvious gross SOB, gasping or wheezing  CV: no obvious cyanosis  MS: moves all visible extremities without noticeable abnormality  PSYCH/NEURO: pleasant and cooperative, no obvious depression or anxiety, speech and thought processing grossly intact  ASSESSMENT AND PLAN:  Discussed the following assessment and plan:  Depression, unspecified depression type  Constipation, unspecified constipation type - Plan: Ambulatory referral to Gastroenterology, TSH Problem List Items Addressed This Visit      Other   Constipation    Chronic. Based on age and chronicity of symptoms, we agreed colonoscopy appropriate. Advised to stimulate from below with enema, ducolax suppository if needed. She will continue miralax daily and let me know if any new concerns or worsening constipation.  Of note: Labs done with Dr Nicolasa Ducking, she will fax to me.       Relevant Orders   Ambulatory referral to Gastroenterology   TSH   Depression    Improved  greatly since has established with Dr Nicolasa Ducking. Will follow.       Relevant Medications   buPROPion (WELLBUTRIN SR) 100 MG 12 hr tablet   traZODone (DESYREL) 100 MG tablet      -we discussed possible serious and likely etiologies, options for evaluation and workup, limitations of telemedicine visit vs in person visit, treatment, treatment risks and precautions. Pt prefers to treat via telemedicine  empirically rather then risking or undertaking an in person visit at this moment. Patient agrees to seek prompt in person care if worsening, new symptoms arise, or if is not improving with treatment.   I discussed the assessment and treatment plan with the patient. The patient was provided an opportunity to ask questions and all were answered. The patient agreed with the plan and demonstrated an understanding of the instructions.   The patient was advised to call back or seek an in-person evaluation if the symptoms worsen or if the condition fails to improve as anticipated.   Mable Paris, FNP

## 2019-07-27 NOTE — Assessment & Plan Note (Addendum)
Chronic. Based on age and chronicity of symptoms, we agreed colonoscopy appropriate. Advised to stimulate from below with enema, ducolax suppository if needed. She will continue miralax daily and let me know if any new concerns or worsening constipation.  Of note: Labs done with Dr Nicolasa Ducking, she will fax to me.

## 2019-07-27 NOTE — Telephone Encounter (Signed)
Left voicemail for pt to schedule 3 month follow up/bhp

## 2019-07-28 ENCOUNTER — Telehealth: Payer: Self-pay | Admitting: Family

## 2019-07-28 ENCOUNTER — Encounter: Payer: Self-pay | Admitting: Family

## 2019-07-28 ENCOUNTER — Encounter: Payer: Self-pay | Admitting: Gastroenterology

## 2019-07-28 NOTE — Telephone Encounter (Signed)
Pt scheduled for 10/19/19.

## 2019-07-28 NOTE — Telephone Encounter (Signed)
Pt would like to get her labs at [facility] will you please change orders so pt can get her labs done there?

## 2019-07-28 NOTE — Telephone Encounter (Signed)
Pt called back to check to see if the labs have been sent over

## 2019-07-29 ENCOUNTER — Telehealth: Payer: Self-pay

## 2019-07-29 ENCOUNTER — Encounter: Payer: Self-pay | Admitting: Gastroenterology

## 2019-07-29 ENCOUNTER — Other Ambulatory Visit: Payer: Self-pay

## 2019-07-29 ENCOUNTER — Other Ambulatory Visit: Payer: Self-pay | Admitting: Family

## 2019-07-29 ENCOUNTER — Ambulatory Visit (INDEPENDENT_AMBULATORY_CARE_PROVIDER_SITE_OTHER): Payer: 59 | Admitting: Gastroenterology

## 2019-07-29 DIAGNOSIS — Z1211 Encounter for screening for malignant neoplasm of colon: Secondary | ICD-10-CM | POA: Diagnosis not present

## 2019-07-29 DIAGNOSIS — K5900 Constipation, unspecified: Secondary | ICD-10-CM

## 2019-07-29 MED ORDER — GOLYTELY 236 G PO SOLR: 4000.0000 mL | Freq: Once | ORAL | 0 refills | Status: AC

## 2019-07-29 MED ORDER — LINACLOTIDE 145 MCG PO CAPS: 145.0000 ug | ORAL_CAPSULE | Freq: Every day | ORAL | 2 refills | Status: DC

## 2019-07-29 NOTE — Progress Notes (Signed)
Denise Gaines [address]  [place]  [place], [place] [address]  Main: [phone]  Fax: [phone]   Gastroenterology Consultation  Referring Provider:     Burnard Hawthorne, FNP Primary Care Physician:  Burnard Hawthorne, FNP Reason for Consultation:   constipation        HPI:   Virtual Visit via Video Note  I connected with patient on 07/29/19 at  2:15 PM EDT by video ([email or web address]) and verified that I am speaking with the correct person using two identifiers.   I discussed the limitations, risks, security and privacy concerns of performing an evaluation and management service by video and the availability of in person appointments. I also discussed with the patient that there may be a patient responsible charge related to this service. The patient expressed understanding and agreed to proceed.  Location of the patient: Home Location of provider: Home Participating persons: Patient and provider only (Nursing staff checked in patient via phone but were not physically involved in the video interaction - see their notes)   History of Present Illness: Chief Complaint  Patient presents with  . New Patient (Initial Visit)  . Constipation    Been going on for once a week. Patient states she goes once a week. Sennma but makes her nausea, colace, Miralax but will not have bowel movements, Metamucil will have irriation    . Hemorrhoids    Patient will have bleeding with bowel movements sometimes     [name] is a 44 y.o. y/o female referred for consultation & management  by Dr. Vidal Schwalbe, Yvetta Coder, FNP.  Patient reports constipation issues for the last 1-1/2-year.  Can go a week without having a bowel movement.  Works for colon health and speech therapy.  Does try to do a high-fiber diet.  Has tried MiraLAX, Metamucil, senna, and other over-the-counter medications.  These do help but sometimes cause diarrhea and bloating and she is unable to continue them at that  point.  Has also had intermittent bright blood per rectum and feels skin tags intermittently.  Does also have some pain when bright red blood per rectum occurs.  Tries sits baths which helps as well.  No prior colonoscopy.  No family history of colon cancer.  Past Medical History:  Diagnosis Date  . Depression   . Obesity     Past Surgical History:  Procedure Laterality Date  . BREAST BIOPSY Right 2017   NEG  . NASAL SINUS SURGERY  [phone]    Prior to Admission medications   Medication Sig Start Date End Date Taking? Authorizing Provider  buPROPion (WELLBUTRIN SR) 100 MG 12 hr tablet Take 100 mg by mouth 2 (two) times daily.   Yes [provider]  cyanocobalamin (,VITAMIN B-12,) 1000 MCG/ML injection Inject 1 mL (1,000 mcg total) into the muscle every 30 (thirty) days. 10/11/17  Yes Shambley, Melody N, CNM  levonorgestrel (MIRENA) 20 MCG/24HR IUD 1 each by Intrauterine route once.   Yes [provider]  sertraline (ZOLOFT) 100 MG tablet Take 1 tablet (100 mg total) by mouth daily. Patient taking differently: Take 150 mg by mouth daily.  07/16/19  Yes Philip Aspen, CNM  traZODone (DESYREL) 100 MG tablet Take 200 mg by mouth at bedtime.   Yes [provider]  linaclotide Rolan Lipa) 145 MCG CAPS capsule Take 1 capsule (145 mcg total) by mouth daily before breakfast. 07/29/19 10/27/19  Virgel Manifold, MD    Family History  Problem Relation  Age of Onset  . Mental illness Mother   . Breast cancer Neg Hx      Social History   Tobacco Use  . Smoking status: Never Smoker  . Smokeless tobacco: Never Used  Substance Use Topics  . Alcohol use: No  . Drug use: No    Allergies as of 07/29/2019  . (No Known Allergies)    Review of Systems:    All systems reviewed and negative except where noted in HPI.   Observations/Objective:  Labs: CBC    Component Value Date/Time   WBC 11.1 (H) 11/12/2014 0854   RBC 4.56 11/12/2014 0854   HGB 13.3  11/12/2014 0854   HCT 38.9 11/12/2014 0854   PLT 308 11/12/2014 0854   MCV 85 11/12/2014 0854   MCH 29.2 11/12/2014 0854   MCHC 34.2 11/12/2014 0854   RDW 13.9 11/12/2014 0854   CMP     Component Value Date/Time   NA 137 03/18/2018 1100   K 4.2 03/18/2018 1100   CL 104 03/18/2018 1100   CO2 20 03/18/2018 1100   GLUCOSE 83 03/18/2018 1100   BUN 10 03/18/2018 1100   CREATININE 0.71 03/18/2018 1100   CALCIUM 8.9 03/18/2018 1100   PROT 5.9 (L) 03/18/2018 1100   ALBUMIN 3.8 03/18/2018 1100   AST 19 03/18/2018 1100   ALT 16 03/18/2018 1100   ALKPHOS 101 03/18/2018 1100   BILITOT 0.3 03/18/2018 1100   GFRNONAA 105 03/18/2018 1100   GFRAA 121 03/18/2018 1100    Imaging Studies: No results found.  Assessment and Plan:   Kailia Jetty is a 44 y.o. y/o female has been referred for constipation  Assessment and Plan: Over-the-counter osmotic laxatives as detailed in HPI have not led to satisfactory results, or caused bloating and she discontinued them.  Therefore, pharmacologic therapy indicated.  Will prescribe Linzess.  Continue high-fiber diet  Patient is due for screening colonoscopy, which would also allow [place] to rule out any obstructive lesions.  Evaluate for hemorrhoids and anal fissure during the procedure.  Can consider steroid suppositories if she has large internal hemorrhoids or banding if indicated  Labs reassuring.  Intermittent bright red blood per rectum likely due to hemorrhoids  Follow Up Instructions:   I discussed the assessment and treatment plan with the patient. The patient was provided an opportunity to ask questions and all were answered. The patient agreed with the plan and demonstrated an understanding of the instructions.   The patient was advised to call back or seek an in-person evaluation if the symptoms worsen or if the condition fails to improve as anticipated.  I provided 15 minutes of face-to-face time via video software during this encounter.   Additional time was spent in reviewing patient's chart, placing orders etc.   Virgel Manifold, MD  Speech recognition software was used to dictate the above note.

## 2019-07-29 NOTE — Telephone Encounter (Signed)
Patient is calling the Linzess is 450 dollars. Patient states she would like something that would be cheaper

## 2019-07-29 NOTE — Telephone Encounter (Signed)
Pt called and stated that she is at [facility] and they can not see the orders. I have corrected orders and pt is aware that [facility] should be able to see them in the next five minutes.

## 2019-07-30 LAB — TSH: TSH: 1.5 u[IU]/mL (ref 0.450–4.500)

## 2019-07-30 MED ORDER — LUBIPROSTONE 8 MCG PO CAPS: 8.0000 ug | ORAL_CAPSULE | Freq: Two times a day (BID) | ORAL | 0 refills | Status: DC

## 2019-07-30 NOTE — Telephone Encounter (Signed)
In epic it looks like Baker Pierini is covered by patient insurance. Per Dr. Bonna Gains call in 65mcg twice a day. Called medication in to pharmacy

## 2019-07-30 NOTE — Telephone Encounter (Signed)
Sent my chart message to inform patient

## 2019-07-31 ENCOUNTER — Other Ambulatory Visit: Payer: Self-pay

## 2019-07-31 DIAGNOSIS — K5900 Constipation, unspecified: Secondary | ICD-10-CM

## 2019-07-31 NOTE — Telephone Encounter (Signed)
I called patient & patient has already had lab done with results received.

## 2019-07-31 NOTE — Progress Notes (Signed)
Close  

## 2019-08-13 MED ORDER — LINACLOTIDE 290 MCG PO CAPS: 290.0000 ug | ORAL_CAPSULE | Freq: Every day | ORAL | 0 refills | Status: DC

## 2019-08-30 ENCOUNTER — Ambulatory Visit (INDEPENDENT_AMBULATORY_CARE_PROVIDER_SITE_OTHER)
Admission: RE | Admit: 2019-08-30 | Discharge: 2019-08-30 | Disposition: A | Discharge status: Home / Self Care | Payer: 59 | Source: Ambulatory Visit

## 2019-08-30 DIAGNOSIS — R0981 Nasal congestion: Secondary | ICD-10-CM | POA: Diagnosis not present

## 2019-08-30 DIAGNOSIS — J0101 Acute recurrent maxillary sinusitis: Secondary | ICD-10-CM

## 2019-08-30 MED ORDER — FLUCONAZOLE 200 MG PO TABS: 200.0000 mg | ORAL_TABLET | Freq: Once | ORAL | 0 refills | Status: AC

## 2019-08-30 MED ORDER — AMOXICILLIN-POT CLAVULANATE 875-125 MG PO TABS: 1.0000 | ORAL_TABLET | Freq: Two times a day (BID) | ORAL | 0 refills | Status: AC

## 2019-08-30 NOTE — ED Provider Notes (Signed)
Virtual Visit via Video Note:  Denise Gaines  initiated request for Telemedicine visit with [facility] team. I connected with Denise Gaines  on 08/30/2019 at 11:28 AM  for a synchronized telemedicine visit using a video enabled HIPPA compliant telemedicine application. I verified that I am speaking with Denise Gaines  using two identifiers. Denise Hall-Potvin, PA-C  was physically located in a [facility] site and Denise Gaines was located at a different location.   The limitations of evaluation and management by telemedicine as well as the availability of in-person appointments were discussed. Patient was informed that she  may incur a bill ( including co-pay) for this virtual visit encounter. Denise Gaines  expressed understanding and gave verbal consent to proceed with virtual visit.     History of Present Illness:Denise Gaines  is a 44 y.o. female presents with weeklong course of severe sinus congestion and pressure.  Patient is s/p sinus surgery x2: Last was 13 years ago.  Patient has had 2 surgeries performed in her left maxillary, one on her right.  Also history of deviated septum.  States that she recently changed offices at work and has been exposed to a lot of dust.  Has been having thick, posterior congestion with severe pressure.  Patient did receive her second dose of Covid vaccine in January.  No fever, cough, known sick contacts.  Has taken Claritin, Sudafed, Nettie pot without relief.  Patient states that she will get a sinus infection 1-2 times yearly.  Tolerates Augmentin well.    ROI as per HPI  Past Medical History:  Diagnosis Date  . Depression   . Obesity     No Known Allergies      Observations/Objective: 44 year old female Sitting in no acute distress.  Patient is able to speak in full sentences without coughing, sneezing, wheezing.  Patient endorsing tenderness when palpating bilateral maxillary sinuses (L>R).  Assessment and Plan: H&P  consistent with acute recurrent maxillary sinusitis.  We will start Augmentin today, Diflucan for post antibiotic use vaginal yeast infection.  Return precautions discussed, patient verbalized understanding and is agreeable to plan.  Follow Up Instructions: Patient to follow-up with ENT in 1 to 2 weeks for further evaluation/management if needed.   I discussed the assessment and treatment plan with the patient. The patient was provided an opportunity to ask questions and all were answered. The patient agreed with the plan and demonstrated an understanding of the instructions.   The patient was advised to call back or seek an in-person evaluation if the symptoms worsen or if the condition fails to improve as anticipated.  I provided 15 minutes of non-face-to-face time during this encounter.    Denise Hall-Potvin, PA-C  08/30/2019 11:28 AM        Gaines, [place], PA-C 08/30/19 1128

## 2019-08-30 NOTE — Discharge Instructions (Addendum)
Take antibiotic 2 times a day with food. May take Diflucan with last dose of antibiotic, again 3 days if needed. Important follow-up with your ENT/PCP for further evaluation/management if needed.

## 2019-09-17 ENCOUNTER — Other Ambulatory Visit: Payer: Self-pay

## 2019-09-17 MED ORDER — LINACLOTIDE 290 MCG PO CAPS: 290.0000 ug | ORAL_CAPSULE | Freq: Every day | ORAL | 3 refills | Status: DC

## 2019-09-21 ENCOUNTER — Other Ambulatory Visit
Admission: RE | Admit: 2019-09-21 | Discharge: 2019-09-21 | Disposition: A | Discharge status: Home / Self Care | Payer: 59 | Source: Ambulatory Visit | Attending: Gastroenterology | Admitting: Gastroenterology

## 2019-09-21 ENCOUNTER — Other Ambulatory Visit: Payer: Self-pay

## 2019-09-21 DIAGNOSIS — Z20822 Contact with and (suspected) exposure to covid-19: Secondary | ICD-10-CM | POA: Diagnosis not present

## 2019-09-21 DIAGNOSIS — Z01812 Encounter for preprocedural laboratory examination: Secondary | ICD-10-CM | POA: Diagnosis not present

## 2019-09-21 LAB — SARS CORONAVIRUS 2 (TAT 6-24 HRS): SARS Coronavirus 2: NEGATIVE

## 2019-09-23 ENCOUNTER — Ambulatory Visit
Admission: RE | Admit: 2019-09-23 | Discharge: 2019-09-23 | Disposition: A | Discharge status: Home / Self Care | Payer: 59 | Source: Ambulatory Visit | Attending: Gastroenterology | Admitting: Gastroenterology

## 2019-09-23 ENCOUNTER — Ambulatory Visit: Payer: 59 | Admitting: Anesthesiology

## 2019-09-23 ENCOUNTER — Encounter: Payer: Self-pay | Admitting: Gastroenterology

## 2019-09-23 ENCOUNTER — Telehealth: Payer: Self-pay

## 2019-09-23 ENCOUNTER — Encounter
Admission: RE | Disposition: A | Discharge status: Home / Self Care | Payer: Self-pay | Source: Ambulatory Visit | Attending: Gastroenterology

## 2019-09-23 ENCOUNTER — Other Ambulatory Visit: Payer: Self-pay

## 2019-09-23 DIAGNOSIS — D123 Benign neoplasm of transverse colon: Secondary | ICD-10-CM | POA: Diagnosis not present

## 2019-09-23 DIAGNOSIS — K649 Unspecified hemorrhoids: Secondary | ICD-10-CM | POA: Insufficient documentation

## 2019-09-23 DIAGNOSIS — Z818 Family history of other mental and behavioral disorders: Secondary | ICD-10-CM | POA: Diagnosis not present

## 2019-09-23 DIAGNOSIS — F329 Major depressive disorder, single episode, unspecified: Secondary | ICD-10-CM | POA: Diagnosis not present

## 2019-09-23 DIAGNOSIS — Z6841 Body Mass Index (BMI) 40.0 and over, adult: Secondary | ICD-10-CM | POA: Diagnosis not present

## 2019-09-23 DIAGNOSIS — K602 Anal fissure, unspecified: Secondary | ICD-10-CM | POA: Insufficient documentation

## 2019-09-23 DIAGNOSIS — K635 Polyp of colon: Secondary | ICD-10-CM | POA: Diagnosis not present

## 2019-09-23 DIAGNOSIS — E669 Obesity, unspecified: Secondary | ICD-10-CM | POA: Insufficient documentation

## 2019-09-23 DIAGNOSIS — Z79899 Other long term (current) drug therapy: Secondary | ICD-10-CM | POA: Insufficient documentation

## 2019-09-23 DIAGNOSIS — Z793 Long term (current) use of hormonal contraceptives: Secondary | ICD-10-CM | POA: Diagnosis not present

## 2019-09-23 DIAGNOSIS — Z1211 Encounter for screening for malignant neoplasm of colon: Secondary | ICD-10-CM

## 2019-09-23 HISTORY — PX: COLONOSCOPY WITH PROPOFOL: SHX5780

## 2019-09-23 LAB — POCT PREGNANCY, URINE: Preg Test, Ur: NEGATIVE

## 2019-09-23 SURGERY — COLONOSCOPY WITH PROPOFOL
Anesthesia: General

## 2019-09-23 MED ORDER — LIDOCAINE HCL (PF) 1 % IJ SOLN
INTRAMUSCULAR | Status: AC
  Filled 2019-09-23: qty 2

## 2019-09-23 MED ORDER — PROPOFOL 10 MG/ML IV BOLUS
INTRAVENOUS | Status: DC | PRN
  Administered 2019-09-23: 50 mg via INTRAVENOUS

## 2019-09-23 MED ORDER — PROPOFOL 500 MG/50ML IV EMUL
INTRAVENOUS | Status: DC | PRN
  Administered 2019-09-23: 175 ug/kg/min via INTRAVENOUS

## 2019-09-23 MED ORDER — LUBIPROSTONE 24 MCG PO CAPS
24.0000 ug | ORAL_CAPSULE | Freq: Two times a day (BID) | ORAL | 2 refills | Status: DC
Start: 2019-09-23 — End: 2019-10-16

## 2019-09-23 MED ORDER — PHENYLEPHRINE HCL (PRESSORS) 10 MG/ML IV SOLN
INTRAVENOUS | Status: DC | PRN
  Administered 2019-09-23 (×2): 100 ug via INTRAVENOUS

## 2019-09-23 MED ORDER — SODIUM CHLORIDE 0.9 % IV SOLN: INTRAVENOUS | Status: DC

## 2019-09-23 MED ORDER — PROPOFOL 500 MG/50ML IV EMUL
INTRAVENOUS | Status: AC
  Filled 2019-09-23: qty 50

## 2019-09-23 NOTE — Op Note (Signed)
[facility] Gastroenterology Patient Name: Denise Gaines Procedure Date: 09/23/2019 8:34 AM MRN: [id number] Account #: [id number] Date of Birth: [birth year] Admit Type: Outpatient Age: 44 Room: [facility] ENDO ROOM 4 Gender: Female Note Status: Finalized Procedure:             Colonoscopy Indications:           Screening for colorectal malignant neoplasm Providers:             Daimen Shovlin B. Maximino Greenland MD, MD Medicines:             Monitored Anesthesia Care Complications:         No immediate complications. Procedure:             Pre-Anesthesia Assessment:                        - ASA Grade Assessment: II - A patient with mild                         systemic disease.                        - Prior to the procedure, a History and Physical was                         performed, and patient medications, allergies and                         sensitivities were reviewed. The patient's tolerance                         of previous anesthesia was reviewed.                        - The risks and benefits of the procedure and the                         sedation options and risks were discussed with the                         patient. All questions were answered and informed                         consent was obtained.                        - Patient identification and proposed procedure were                         verified prior to the procedure by the physician, the                         nurse, the anesthesiologist, the anesthetist and the                         technician. The procedure was verified in the                         procedure room.  After obtaining informed consent, the colonoscope was                         passed under direct vision. Throughout the procedure,                         the patient's blood pressure, pulse, and oxygen                         saturations were monitored continuously. The                         Colonoscope  was introduced through the anus and                         advanced to the the cecum, identified by appendiceal                         orifice and ileocecal valve. The colonoscopy was                         performed with ease. The patient tolerated the                         procedure well. The quality of the bowel preparation                         was fair except the cecum was poor. Findings:      The perianal exam findings include anal fissure.      Hemorrhoids were found on perianal exam.      A 6 mm polyp was found in the transverse colon. The polyp was flat. The       polyp was removed with a cold snare. Resection and retrieval were       complete.      The exam was otherwise without abnormality.      The rectum, sigmoid colon, descending colon, transverse colon, ascending       colon and cecum appeared normal.      The retroflexed view of the distal rectum and anal verge was normal and       showed no anal or rectal abnormalities. Impression:            - Anal fissure found on perianal exam.                        - Hemorrhoids found on perianal exam.                        - One 6 mm polyp in the transverse colon, removed with                         a cold snare. Resected and retrieved.                        - The examination was otherwise normal.                        - The rectum, sigmoid colon, descending colon,  transverse colon, ascending colon and cecum are normal.                        - The distal rectum and anal verge are normal on                         retroflexion view. Recommendation:        - Discharge patient to home (with escort).                        - Advance diet as tolerated.                        - Continue present medications.                        - Await pathology results.                        - Repeat colonoscopy in 1 year, with 2 day prep.                        - The findings and recommendations were discussed  with                         the patient.                        - The findings and recommendations were discussed with                         the patient's family.                        - Return to primary care physician as previously                         scheduled.                        - High fiber diet.                        - Anal fissure medication to be sent to specialty                         pharmacy Procedure Code(s):     --- Professional ---                        [phone], Colonoscopy, flexible; with removal of                         tumor(s), polyp(s), or other lesion(s) by snare                         technique Diagnosis Code(s):     --- Professional ---                        Z12.11, Encounter for screening for malignant neoplasm  of colon                        K63.5, Polyp of colon                        K64.9, Unspecified hemorrhoids                        K60.2, Anal fissure, unspecified CPT copyright 2019 American Medical Association. All rights reserved. The codes documented in this report are preliminary and upon coder review may  be revised to meet current compliance requirements.  Melodie Bouillon, MD Michel Bickers B. Maximino Greenland MD, MD 09/23/2019 9:21:35 AM This report has been signed electronically. Number of Addenda: 0 Note Initiated On: 09/23/2019 8:34 AM Scope Withdrawal Time: 0 hours 18 minutes 34 seconds  Total Procedure Duration: 0 hours 26 minutes 1 second  Estimated Blood Loss:  Estimated blood loss: none.      [facility]

## 2019-09-23 NOTE — Telephone Encounter (Signed)
-----   Message from Virgel Manifold, MD sent at 09/23/2019  9:23 AM EDT ----- Pt will need Nifedipine 0.2% ointment twice a day for 4 weeks sent to specialty pharmacy for anal fissure. You can ask Caryl Pina how to prescribe it. Thank you

## 2019-09-23 NOTE — Telephone Encounter (Signed)
[facility] called ans stated that they did not carry the Nifedipine ointment. Therefore, [facility] was contacted in [place] and it was sent as a verbal order. Patient was contacted and notified as a voicemail in reference to her Nifedipine ointment sent to [facility] and [place] to her pharmacy [facility] on [address].  [facility] [phone]

## 2019-09-23 NOTE — Transfer of Care (Signed)
Immediate Anesthesia Transfer of Care Note  Patient: Babette Beane  Procedure(s) Performed: COLONOSCOPY WITH PROPOFOL (N/A )  Patient Location: PACU  Anesthesia Type:General  Level of Consciousness: awake, alert  and oriented  Airway & Oxygen Therapy: Patient Spontanous Breathing and Patient connected to nasal cannula oxygen  Post-op Assessment: Report given to RN and Post -op Vital signs reviewed and stable  Post vital signs: Reviewed and stable  Last Vitals:  Vitals Value Taken Time  BP 138/122 09/23/19 0919  Temp    Pulse 75 09/23/19 0919  Resp 18 09/23/19 0919  SpO2 99 % 09/23/19 0919  Vitals shown include unvalidated device data.  Last Pain:  Vitals:   09/23/19 0750  TempSrc: Temporal  PainSc: 0-No pain         Complications: No apparent anesthesia complications

## 2019-09-23 NOTE — Anesthesia Procedure Notes (Signed)
Date/Time: 09/23/2019 8:52 AM Performed by: Nelda Marseille, CRNA Pre-anesthesia Checklist: Patient identified, Emergency Drugs available, Suction available, Patient being monitored and Timeout performed Oxygen Delivery Method: Nasal cannula

## 2019-09-23 NOTE — H&P (Signed)
Denise Antigua, MD [address], [place], [place], [place], [address] [id number], [place], [place], [place], [address] Phone: [phone]  Fax: [phone]  Primary Care Physician:  Burnard Hawthorne, FNP   Pre-Procedure History & Physical: HPI:  Denise Gaines is a 44 y.o. female is here for a colonoscopy.   Past Medical History:  Diagnosis Date  . Depression   . Obesity     Past Surgical History:  Procedure Laterality Date  . BREAST BIOPSY Right 2017   NEG  . NASAL SINUS SURGERY  [phone]    Prior to Admission medications   Medication Sig Start Date End Date Taking? Authorizing Provider  buPROPion (WELLBUTRIN SR) 100 MG 12 hr tablet Take 100 mg by mouth 2 (two) times daily.   Yes [provider]  cyanocobalamin (,VITAMIN B-12,) 1000 MCG/ML injection Inject 1 mL (1,000 mcg total) into the muscle every 30 (thirty) days. 10/11/17  Yes Shambley, Melody N, CNM  linaclotide (LINZESS) 290 MCG CAPS capsule Take 1 capsule (290 mcg total) by mouth daily before breakfast. 09/17/19  Yes Denise Gaines B, MD  sertraline (ZOLOFT) 100 MG tablet Take 1 tablet (100 mg total) by mouth daily. Patient taking differently: Take 150 mg by mouth daily.  07/16/19  Yes Philip Aspen, CNM  traZODone (DESYREL) 100 MG tablet Take 200 mg by mouth at bedtime.   Yes [provider]  levonorgestrel (MIRENA) 20 MCG/24HR IUD 1 each by Intrauterine route once.    [provider]  lubiprostone (AMITIZA) 8 MCG capsule Take 1 capsule (8 mcg total) by mouth 2 (two) times daily with a meal. Patient not taking: Reported on 09/23/2019 07/30/19   Virgel Manifold, MD    Allergies as of 07/29/2019  . (No Known Allergies)    Family History  Problem Relation Age of Onset  . Mental illness Mother   . Breast cancer Neg Hx     Social History   Socioeconomic History  . Marital status: Married    Spouse name: Not on file  . Number of children: Not on file  . Years of  education: Not on file  . Highest education level: Not on file  Occupational History  . Not on file  Tobacco Use  . Smoking status: Never Smoker  . Smokeless tobacco: Never Used  Substance and Sexual Activity  . Alcohol use: No  . Drug use: No  . Sexual activity: Yes    Birth control/protection: I.U.D.    Comment: mirena  Other Topics Concern  . 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 Difficulty of Paying Living Expenses:   Food Insecurity:   . Worried About Charity fundraiser in the Last Year:   . Arboriculturist in the Last Year:   Transportation Needs:   . Film/video editor (Medical):   Marland Kitchen Lack of Transportation (Non-Medical):   Physical Activity:   . Days of Exercise per Week:   . Minutes of Exercise per Session:   Stress:   . Feeling of Stress :   Social Connections:   . Frequency of Communication with Friends and Family:   . Frequency of Social Gatherings with Friends and Family:   . Attends Religious Services:   . Active Member of Clubs or Organizations:   . Attends Archivist Meetings:   Marland Kitchen Marital Status:  Intimate Partner Violence:   . Fear of Current or Ex-Partner:   . Emotionally Abused:   Marland Kitchen Physically Abused:   . Sexually Abused:     Review of Systems: See HPI, otherwise negative ROS  Physical Exam: BP 108/79   Pulse 63   Temp 97.7 F (36.5 C) (Temporal)   Resp 16   Ht 5\' 6"  (1.676 m)   Wt 120.2 kg   SpO2 100%   BMI 42.77 kg/m  General:   Alert,  pleasant and cooperative in NAD Head:  Normocephalic and atraumatic. Neck:  Supple; no masses or thyromegaly. Lungs:  Clear throughout to auscultation, normal respiratory effort.    Heart:  +S1, +S2, Regular rate and rhythm, No edema. Abdomen:  Soft, nontender and nondistended. Normal bowel sounds, without guarding, and without rebound.   Neurologic:  Alert and  oriented x4;  grossly normal  neurologically.  Impression/Plan: Denise Gaines is here for a colonoscopy to be performed for average risk screening.  Risks, benefits, limitations, and alternatives regarding  colonoscopy have been reviewed with the patient.  Questions have been answered.  All parties agreeable.   Virgel Manifold, MD  09/23/2019, 8:39 AM

## 2019-09-23 NOTE — Telephone Encounter (Signed)
[facility] was contacted and gave a verbal order for the Nifedipine 0.2% ointment BID for 4 weeks for her anal fissure. [facility] will have it ready for her by tomorrow. Patient was called and left her a detailed message letting her know that her ointment will be ready by tomorrow.

## 2019-09-23 NOTE — Anesthesia Preprocedure Evaluation (Signed)
Anesthesia Evaluation  Patient identified by MRN, date of birth, ID band Patient awake    Reviewed: Allergy & Precautions, H&P , NPO status , Patient's Chart, lab work & pertinent test results  Airway Mallampati: II  TM Distance: >3 FB Neck ROM: full    Dental  (+) Teeth Intact, Caps   Pulmonary neg pulmonary ROS,    Pulmonary exam normal breath sounds clear to auscultation       Cardiovascular (-) hypertensionnegative cardio ROS Normal cardiovascular exam Rhythm:regular Rate:Normal     Neuro/Psych negative neurological ROS  negative psych ROS   GI/Hepatic negative GI ROS, Neg liver ROS,   Endo/Other  negative endocrine ROS  Renal/GU negative Renal ROS  negative genitourinary   Musculoskeletal   Abdominal   Peds  Hematology negative hematology ROS (+)   Anesthesia Other Findings Obese  Past Medical History: No date: Depression No date: Obesity  Past Surgical History: 2017: BREAST BIOPSY; Right     Comment:  NEG [id number]: NASAL SINUS SURGERY  BMI    Body Mass Index: 42.77 kg/m      Reproductive/Obstetrics negative OB ROS                             Anesthesia Physical Anesthesia Plan  ASA: II  Anesthesia Plan: General   Post-op Pain Management:    Induction:   PONV Risk Score and Plan: Propofol infusion and TIVA  Airway Management Planned: Nasal Cannula and Natural Airway  Additional Equipment:   Intra-op Plan:   Post-operative Plan:   Informed Consent: I have reviewed the patients History and Physical, chart, labs and discussed the procedure including the risks, benefits and alternatives for the proposed anesthesia with the patient or authorized representative who has indicated his/her understanding and acceptance.     Dental Advisory Given  Plan Discussed with: Anesthesiologist, CRNA and Surgeon  Anesthesia Plan Comments:         Anesthesia Quick  Evaluation

## 2019-09-24 ENCOUNTER — Encounter: Payer: Self-pay | Admitting: *Deleted

## 2019-09-24 ENCOUNTER — Encounter: Payer: Self-pay | Admitting: Gastroenterology

## 2019-09-24 LAB — SURGICAL PATHOLOGY

## 2019-09-26 NOTE — Anesthesia Postprocedure Evaluation (Signed)
Anesthesia Post Note  Patient: Denise Gaines  Procedure(s) Performed: COLONOSCOPY WITH PROPOFOL (N/A )  Patient location during evaluation: PACU Anesthesia Type: General Level of consciousness: awake and alert Pain management: pain level controlled Vital Signs Assessment: post-procedure vital signs reviewed and stable Respiratory status: spontaneous breathing, nonlabored ventilation and respiratory function stable Cardiovascular status: blood pressure returned to baseline and stable Postop Assessment: no apparent nausea or vomiting Anesthetic complications: no     Last Vitals:  Vitals:   09/23/19 0938 09/23/19 0958  BP: 96/70 108/65  Pulse:    Resp:    Temp:    SpO2:      Last Pain:  Vitals:   09/23/19 0958  TempSrc:   PainSc: 0-No pain                 Tera Mater

## 2019-09-28 ENCOUNTER — Ambulatory Visit: Payer: 59 | Admitting: Dietician

## 2019-09-29 ENCOUNTER — Ambulatory Visit: Payer: 59 | Admitting: Dietician

## 2019-10-06 ENCOUNTER — Other Ambulatory Visit: Payer: Self-pay

## 2019-10-06 ENCOUNTER — Encounter: Payer: 59 | Attending: Family | Admitting: Dietician

## 2019-10-06 VITALS — Ht 66.0 in | Wt 259.3 lb

## 2019-10-06 DIAGNOSIS — Z6841 Body Mass Index (BMI) 40.0 and over, adult: Secondary | ICD-10-CM

## 2019-10-06 NOTE — Patient Instructions (Addendum)
·   Identifying binge eating triggers and find a non-food alternative   Try to incorporate 1-2 fiber rich foods a day   Drink 4-6 cups of water a day   Journal meals and snacks to get comfortable with controlling carb servings  Jethro Bastos, RD, LDN Nutrition and Diabetes Education [place] Office: [phone] [email or web address]

## 2019-10-06 NOTE — Progress Notes (Signed)
 Employee "self referral" nutrition session: Start time: 430   End time: 530  Height: 5'6" Weight: 259.3 lbs  Met with employee to discuss his/her nutritional concerns and diet history.   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   Two kids, husband and puts others first, working on boundaries  Struggled with depression   Two years of constipation, usually BM 2x/week  Most successful in past with weight watchers   Typical eating pattern: Breakfast: cereal, yogurt with a bar Snack: chips, popcorn, pretzels, nuts, crackers Lunch: sandwich, salads Snack: same as above Supper: take out 2x week, salmon with peas, corn and potatos Snack: same as above Beverages:  Water, diet soda   Education topics covered during this visit:  General nutrition/ Healthy eating  Weight Concerns  Other Medical Conditions: chronic constipation  Educational resources provided:  Art therapist  EatRight Healthy snacking handout  NCM Weight Loss Tips  10 Healthy Eating habits   Am I hungry flow chart  101 things to do besides eat   Plan:  Increase fiber intake AND water intake to address chronic constipation  Discuss probiotics and prebiotics at follow up   Follow up: Tuesday, June 29 at 5p

## 2019-10-07 ENCOUNTER — Encounter: Payer: Self-pay | Admitting: Dietician

## 2019-10-16 ENCOUNTER — Other Ambulatory Visit: Payer: Self-pay

## 2019-10-16 ENCOUNTER — Telehealth: Payer: Self-pay | Admitting: Family

## 2019-10-16 MED ORDER — LUBIPROSTONE 24 MCG PO CAPS: 24.0000 ug | ORAL_CAPSULE | Freq: Two times a day (BID) | ORAL | 0 refills | Status: AC

## 2019-10-16 NOTE — Telephone Encounter (Signed)
Pt cancelled appt on Monday 10/19/19 for 3 month follow up. She said she didn't see the need for the appointment since you referred her to gastro doctor. She scheduled physical for next year. Just wanted you to know.

## 2019-10-19 ENCOUNTER — Ambulatory Visit: Payer: 59 | Admitting: Family

## 2019-10-28 ENCOUNTER — Ambulatory Visit: Payer: 59 | Admitting: Family

## 2019-11-03 ENCOUNTER — Ambulatory Visit: Payer: 59 | Admitting: Dietician

## 2020-03-07 ENCOUNTER — Telehealth: Payer: 59 | Admitting: Nurse Practitioner

## 2020-03-07 DIAGNOSIS — J329 Chronic sinusitis, unspecified: Secondary | ICD-10-CM

## 2020-03-07 DIAGNOSIS — B9789 Other viral agents as the cause of diseases classified elsewhere: Secondary | ICD-10-CM

## 2020-03-07 MED ORDER — FLUTICASONE PROPIONATE 50 MCG/ACT NA SUSP
2.0000 | Freq: Every day | NASAL | 6 refills | Status: DC
Start: 2020-03-07 — End: 2020-10-20

## 2020-03-07 NOTE — Progress Notes (Signed)
We are sorry that you are not feeling well.  Here is how we plan to help!  Based on what you have shared with me it looks like you have sinusitis.  Sinusitis is inflammation and infection in the sinus cavities of the head.  Based on your presentation I believe you most likely have Acute Viral Sinusitis.This is an infection most likely caused by a virus. There is not specific treatment for viral sinusitis other than to help you with the symptoms until the infection runs its course.  You may use an oral decongestant such as Mucinex D or if you have glaucoma or high blood pressure use plain Mucinex. Saline nasal spray help and can safely be used as often as needed for congestion, I have prescribed: Fluticasone nasal spray two sprays in each nostril once a day   Providers prescribe antibiotics to treat infections caused by bacteria. Antibiotics are very powerful in treating bacterial infections when they are used properly. To maintain their effectiveness, they should be used only when necessary. Overuse of antibiotics has resulted in the development of superbugs that are resistant to treatment!    After careful review of your answers, I would not recommend an antibiotic for your condition.  Antibiotics are not effective against viruses and therefore should not be used to treat them. Common examples of infections caused by viruses include colds and flu    Some authorities believe that zinc sprays or the use of Echinacea may shorten the course of your symptoms.  Sinus infections are not as easily transmitted as other respiratory infection, however we still recommend that you avoid close contact with loved ones, especially the very young and elderly.  Remember to wash your hands thoroughly throughout the day as this is the number one way to prevent the spread of infection!  Home Care:  Only take medications as instructed by your medical team.  Do not take these medications with alcohol.  A steam or  ultrasonic humidifier can help congestion.  You can place a towel over your head and breathe in the steam from hot water coming from a faucet.  Avoid close contacts especially the very young and the elderly.  Cover your mouth when you cough or sneeze.  Always remember to wash your hands.  Get Help Right Away If:  You develop worsening fever or sinus pain.  You develop a severe head ache or visual changes.  Your symptoms persist after you have completed your treatment plan.  Make sure you  Understand these instructions.  Will watch your condition.  Will get help right away if you are not doing well or get worse.  Your e-visit answers were reviewed by a board certified advanced clinical practitioner to complete your personal care plan.  Depending on the condition, your plan could have included both over the counter or prescription medications.  If there is a problem please reply  once you have received a response from your provider.  Your safety is important to us.  If you have drug allergies check your prescription carefully.    You can use MyChart to ask questions about today's visit, request a non-urgent call back, or ask for a work or school excuse for 24 hours related to this e-Visit. If it has been greater than 24 hours you will need to follow up with your provider, or enter a new e-Visit to address those concerns.  You will get an e-mail in the next two days asking about your experience.  I   hope that your e-visit has been valuable and will speed your recovery. Thank you for using e-visits.   5-10 minutes spent reviewing and documenting in chart.  

## 2020-03-11 ENCOUNTER — Ambulatory Visit: Payer: Self-pay

## 2020-05-18 ENCOUNTER — Other Ambulatory Visit: Payer: Self-pay | Admitting: Gastroenterology

## 2020-05-18 MED ORDER — HYDROCORTISONE ACETATE 1 % EX OINT: 1.0000 "application " | TOPICAL_OINTMENT | Freq: Every day | CUTANEOUS | 0 refills | Status: AC

## 2020-07-08 ENCOUNTER — Ambulatory Visit
Admission: EM | Admit: 2020-07-08 | Discharge: 2020-07-08 | Disposition: A | Discharge status: Home / Self Care | Payer: 59 | Attending: Family Medicine | Admitting: Family Medicine

## 2020-07-08 DIAGNOSIS — L600 Ingrowing nail: Secondary | ICD-10-CM | POA: Diagnosis not present

## 2020-07-08 MED ORDER — CEPHALEXIN 500 MG PO CAPS
500.0000 mg | ORAL_CAPSULE | Freq: Four times a day (QID) | ORAL | 0 refills | Status: DC
Start: 2020-07-08 — End: 2020-10-20

## 2020-07-08 NOTE — ED Provider Notes (Signed)
Denise Gaines    CSN: [id number] Arrival date & time: 07/08/20  1509      History   Chief Complaint Chief Complaint  Patient presents with  . toenail pain, poss ingrown toenail    HPI Erikka Bansal is a 45 y.o. female.   Pt is a 45 year old female that presents with pain to right great toenail pain, inflammation for approx 2.5 weeks. Pt states she attempted to "cut it out" about 2.5 weeks ago, and it is not resolving. Denies drainage from site, red streaks to foot, fever.     Past Medical History:  Diagnosis Date  . Depression   . Obesity     Patient Active Problem List   Diagnosis Date Noted  . Screening for colon cancer   . Polyp of colon   . Constipation 07/27/2019  . Sleep disturbance 11/12/2014  . Depression 09/27/2014  . Obesity 09/27/2014    Past Surgical History:  Procedure Laterality Date  . BREAST BIOPSY Right 2017   NEG  . COLONOSCOPY WITH PROPOFOL N/A 09/23/2019   Procedure: COLONOSCOPY WITH PROPOFOL;  Surgeon: Virgel Manifold, MD;  Location: [facility] ENDOSCOPY;  Service: Endoscopy;  Laterality: N/A;  . NASAL SINUS SURGERY  1993,2009    OB History    Gravida  2   Para  2   Term  2   Preterm      AB      Living  2     SAB      IAB      Ectopic      Multiple      Live Births  2            Home Medications    Prior to Admission medications   Medication Sig Start Date End Date Taking? Authorizing Provider  buPROPion (WELLBUTRIN SR) 100 MG 12 hr tablet Take 100 mg by mouth 2 (two) times daily.   Yes [provider]  cephALEXin (KEFLEX) 500 MG capsule Take 1 capsule (500 mg total) by mouth 4 (four) times daily. 07/08/20  Yes Jadis Mika A, NP  levonorgestrel (MIRENA) 20 MCG/24HR IUD 1 each by Intrauterine route once.   Yes [provider]  sertraline (ZOLOFT) 100 MG tablet Take 1 tablet (100 mg total) by mouth daily. Patient taking differently: Take 150 mg by mouth daily. 07/16/19  Yes Philip Aspen,  CNM  traZODone (DESYREL) 100 MG tablet Take 200 mg by mouth at bedtime.   Yes [provider]  cyanocobalamin (,VITAMIN B-12,) 1000 MCG/ML injection Inject 1 mL (1,000 mcg total) into the muscle every 30 (thirty) days. 10/11/17   Shambley, Melody N, CNM  fluticasone (FLONASE) 50 MCG/ACT nasal spray Place 2 sprays into both nostrils daily. 03/07/20   Chevis Pretty, FNP    Family History Family History  Problem Relation Age of Onset  . Mental illness Mother   . Breast cancer Neg Hx     Social History Social History   Tobacco Use  . Smoking status: Never Smoker  . Smokeless tobacco: Never Used  Vaping Use  . Vaping Use: Never used  Substance Use Topics  . Alcohol use: No  . Drug use: No     Allergies   Patient has no known allergies.   Review of Systems Review of Systems   Physical Exam Triage Vital Signs ED Triage Vitals  Enc Vitals Group     BP 07/08/20 1525 127/85     Pulse Rate 07/08/20 1525  86     Resp 07/08/20 1525 20     Temp 07/08/20 1525 98.1 F (36.7 C)     Temp Source 07/08/20 1525 Oral     SpO2 07/08/20 1525 97 %     Weight --      Height --      Head Circumference --      Peak Flow --      Pain Score 07/08/20 1521 9     Pain Loc --      Pain Edu? --      Excl. in [place]? --    No data found.  Updated Vital Signs BP 127/85 (BP Location: Left Arm)   Pulse 86   Temp 98.1 F (36.7 C) (Oral)   Resp 20   SpO2 97%   Visual Acuity Right Eye Distance:   Left Eye Distance:   Bilateral Distance:    Right Eye Near:   Left Eye Near:    Bilateral Near:     Physical Exam Vitals and nursing note reviewed.  Constitutional:      General: She is not in acute distress.    Appearance: Normal appearance. She is not ill-appearing, toxic-appearing or diaphoretic.  HENT:     Head: Normocephalic.  Eyes:     Conjunctiva/sclera: Conjunctivae normal.  Pulmonary:     Effort: Pulmonary effort is normal.  Musculoskeletal:        General:  Normal range of motion.     Cervical back: Normal range of motion.  Feet:     Right foot:     Skin integrity: Erythema present.     Toenail Condition: Right toenails are ingrown.  Skin:    General: Skin is warm and dry.     Findings: No rash.  Neurological:     Mental Status: She is alert.  Psychiatric:        Mood and Affect: Mood normal.      UC Treatments / Results  Labs (all labs ordered are listed, but only abnormal results are displayed) Labs Reviewed - No data to display  EKG   Radiology No results found.  Procedures Procedures (including critical care time)  Medications Ordered in UC Medications - No data to display  Initial Impression / Assessment and Plan / UC Course  I have reviewed the triage vital signs and the nursing notes.  Pertinent labs & imaging results that were available during my care of the patient were reviewed by me and considered in my medical decision making (see chart for details).     Ingrown toenail with infection Treating with warm soaks and oral abx Recommended follow up with podiatry.   Final Clinical Impressions(s) / UC Diagnoses   Final diagnoses:  Ingrowing toenail with infection     Discharge Instructions     Take the antibiotics as prescribed Warm soaks to the foot.  See podiatry for follow up as needed.  Follow up as needed for continued or worsening symptoms     ED Prescriptions    Medication Sig Dispense Auth. Provider   cephALEXin (KEFLEX) 500 MG capsule Take 1 capsule (500 mg total) by mouth 4 (four) times daily. 28 capsule Blessin Kanno A, NP     PDMP not reviewed this encounter.   Orvan July, NP 07/08/20 1541

## 2020-07-08 NOTE — ED Triage Notes (Signed)
Pt c/o pain to right great toenail pain, inflammation for approx 2.5 weeks. Pt states she attempted to "cut it out" about 2.5 weeks ago, and it is not resolving.  Denies drainage from site, red streaks to foot, fever. Right great toe with erythema around cuticle/nailbed, redness radiating to distal joint

## 2020-07-08 NOTE — Discharge Instructions (Addendum)
Take the antibiotics as prescribed Warm soaks to the foot.  See podiatry for follow up as needed.  Follow up as needed for continued or worsening symptoms

## 2020-09-23 ENCOUNTER — Encounter: Payer: Self-pay | Admitting: Family

## 2020-10-17 ENCOUNTER — Encounter: Payer: 59 | Admitting: Family

## 2020-10-20 ENCOUNTER — Encounter: Payer: Self-pay | Admitting: Family

## 2020-10-20 ENCOUNTER — Other Ambulatory Visit (HOSPITAL_COMMUNITY)
Admission: RE | Admit: 2020-10-20 | Discharge: 2020-10-20 | Disposition: A | Discharge status: Home / Self Care | Payer: 59 | Source: Ambulatory Visit | Attending: Family | Admitting: Family

## 2020-10-20 ENCOUNTER — Ambulatory Visit (INDEPENDENT_AMBULATORY_CARE_PROVIDER_SITE_OTHER): Payer: 59 | Admitting: Family

## 2020-10-20 ENCOUNTER — Other Ambulatory Visit: Payer: Self-pay

## 2020-10-20 VITALS — BP 120/68 | HR 83 | Temp 96.4°F | Ht 66.0 in | Wt 268.2 lb

## 2020-10-20 DIAGNOSIS — Z0000 Encounter for general adult medical examination without abnormal findings: Secondary | ICD-10-CM

## 2020-10-20 DIAGNOSIS — K5900 Constipation, unspecified: Secondary | ICD-10-CM

## 2020-10-20 DIAGNOSIS — F32A Depression, unspecified: Secondary | ICD-10-CM | POA: Diagnosis not present

## 2020-10-20 MED ORDER — WEGOVY 0.25 MG/0.5ML ~~LOC~~ SOAJ: 0.2500 mg | SUBCUTANEOUS | 2 refills | Status: DC

## 2020-10-20 NOTE — Patient Instructions (Addendum)
We have discussed starting non insulin daily injectable medication called Wegovy  which is a glucagon like peptide (GLP 1) agonist and works by delaying gastric emptying and increasing insulin secretion.It is given once per week. Most patients see significant weight loss with this drug class.   You may NOT take either medication if you or your family has history of thyroid, parathyroid, OR adrenal cancer. Please confirm you and your family does NOT have this history as this drug class has black box warning on this medication for that reason.   Advise to follow with directions on prescription and slowly increase from 0.25mg  Denise Gaines once per week ;stay here for 4 weeks. You may then increase to 0.5mg  Denise Gaines once per week and stay there for 4 weeks.  We can slowly titrate further at follow up with goal of no more than 1-2 lbs weight loss per week.  Take Miralax once to twice per day until you have a bowel movement. You will end up titrating the use of Miralax. For example, you  may find that using the medication every other day or three times a week is a good bowel regimen for you. Or perhaps, twice weekly.   If you do not get results with Miralax alone, you may then add Bisacodyl suppository daily to regimen until you get desired bowel results.  Take Colace ( stool softener) twice daily every day.   It is MOST important to drink LOTS of water and follow a HIGH fiber diet to keep foods moving through the gut. You may add Metamucil to a beverage that you drink.  Information on prevention of constipation as well as acute treatment for constipation as included below.  If there is no improvement in your symptoms, or if there is any worsening of symptoms, or if you have any additional concerns, please return to this clinic for re-evaluation; or, if we are closed, consider going to the Emergency Room for evaluation.    Constipation Prevention What is Constipation? Constipation is hard, dry bowel movements or  the inability to have a bowel movement.  You can also feel like you need to have a bowel movement but not be able to.  It can also be painful when you strain to have a bowel movement.  Taking narcotic pain medicine after surgery can make you constipated, even if you have never had a problem with constipation. What Do I Need To Do? The best thing to do for constipation is to keep it from happening.  This can be done by: Adding laxatives to your daily routine, when taking prescription pain medicines after surgery. Add 17 gm Miralax daily or 100 mg Colace once or twice daily. (Miralax is mixed in water. Colace is a pill). They soften your bowel movements to make them easier to pass and hurt less. Drink plenty of water to help flush your bowels.  (Eight, 8 ounce glasses daily) Eat foods high in fiber such as whole grains, vegetables, cereals, fruits, and prune juice (5-7 servings a day or 25 grams).  If you do not know how much fiber a food has in it you can look on the label under "dietary fiber."  If you have trouble getting enough fiber in your diet you may want to consider a fiber supplement such as Metamucil or Citrucel.  Also, be aware that eating fiber without drinking enough water can make constipation worse. If you do become constipated some medications that may help are: Bisacodyl (Dulcolax) is available in tablet  form or a suppository. Glycerin suppositories are also a good choice if you need a fast acting medication. Everybody is different and may have different results.  Talk to your pharmacist or health care provider about your specific problems. They can help you choose the best product for you.  Why Is It Important for Me To Do This? Being constipated is not something you have to live with.  There are many things you can do to help.   Feeling bad can interfere with your recovery after surgery.  If constipation goes on for too long it can become a very serious medical problem. You may need to  visit your doctor or go to the hospital.  That is why it is very important to drink lots of water, eat enough fiber, and keep it from happening.  Ask Questions We want to answer all of your questions and concerns.  That's why we encourage you to use a program called Ask Me 3T, created by the [employer].  By using Ask Me 3T you are encouraged to ask 3 simple (yet, potentially life saving questions) whenever you are talking with your physician, nurse or pharmacist: What is my main problem? What do I need to do? Why is it important for me to do this? By understanding the answer to these three questions and any other questions you may have, you have the knowledge necessary to manage your health. Please feel very comfortable asking any questions. Healthcare is complicated, so if you hear an answer you do not understand, please ask your health care team to explain again.   Sources: [employer] Med[employer] Plus 02-23-10 N Health Maintenance, Female Adopting a healthy lifestyle and getting preventive care are important in promoting health and wellness. Ask your health care provider about: The right schedule for you to have regular tests and exams. Things you can do on your own to prevent diseases and keep yourself healthy. What should I know about diet, weight, and exercise? Eat a healthy diet  Eat a diet that includes plenty of vegetables, fruits, low-fat dairy products, and lean protein. Do not eat a lot of foods that are high in solid fats, added sugars, or sodium.  Maintain a healthy weight Body mass index (BMI) is used to identify weight problems. It estimates body fat based on height and weight. Your health care provider can help determineyour BMI and help you achieve or maintain a healthy weight. Get regular exercise Get regular exercise. This is one of the most important things you can do for your health. Most adults should: Exercise for at least 150  minutes each week. The exercise should increase your heart rate and make you sweat (moderate-intensity exercise). Do strengthening exercises at least twice a week. This is in addition to the moderate-intensity exercise. Spend less time sitting. Even light physical activity can be beneficial. Watch cholesterol and blood lipids Have your blood tested for lipids and cholesterol at 46 years of age, then havethis test every 5 years. Have your cholesterol levels checked more often if: Your lipid or cholesterol levels are high. You are older than 46 years of age. You are at high risk for heart disease. What should I know about cancer screening? Depending on your health history and family history, you may need to have cancer screening at various ages. This may include screening for: Breast cancer. Cervical cancer. Colorectal cancer. Skin cancer. Lung cancer. What should I know about heart disease, diabetes, and high blood pressure?  Blood pressure and heart disease High blood pressure causes heart disease and increases the risk of stroke. This is more likely to develop in people who have high blood pressure readings, are of African descent, or are overweight. Have your blood pressure checked: Every 3-5 years if you are 70-14 years of age. Every year if you are 54 years old or older. Diabetes Have regular diabetes screenings. This checks your fasting blood sugar level. Have the screening done: Once every three years after age 15 if you are at a normal weight and have a low risk for diabetes. More often and at a younger age if you are overweight or have a high risk for diabetes. What should I know about preventing infection? Hepatitis B If you have a higher risk for hepatitis B, you should be screened for this virus. Talk with your health care provider to find out if you are at risk forhepatitis B infection. Hepatitis C Testing is recommended for: Everyone born from [birth year] through 1966. Anyone  with known risk factors for hepatitis C. Sexually transmitted infections (STIs) Get screened for STIs, including gonorrhea and chlamydia, if: You are sexually active and are younger than 46 years of age. You are older than 46 years of age and your health care provider tells you that you are at risk for this type of infection. Your sexual activity has changed since you were last screened, and you are at increased risk for chlamydia or gonorrhea. Ask your health care provider if you are at risk. Ask your health care provider about whether you are at high risk for HIV. Your health care provider may recommend a prescription medicine to help prevent HIV infection. If you choose to take medicine to prevent HIV, you should first get tested for HIV. You should then be tested every 3 months for as long as you are taking the medicine. Pregnancy If you are about to stop having your period (premenopausal) and you may become pregnant, seek counseling before you get pregnant. Take 400 to 800 micrograms (mcg) of folic acid every day if you become pregnant. Ask for birth control (contraception) if you want to prevent pregnancy. Osteoporosis and menopause Osteoporosis is a disease in which the bones lose minerals and strength with aging. This can result in bone fractures. If you are 31 years old or older, or if you are at risk for osteoporosis and fractures, ask your health care provider if you should: Be screened for bone loss. Take a calcium or vitamin D supplement to lower your risk of fractures. Be given hormone replacement therapy (HRT) to treat symptoms of menopause. Follow these instructions at home: Lifestyle Do not use any products that contain nicotine or tobacco, such as cigarettes, e-cigarettes, and chewing tobacco. If you need help quitting, ask your health care provider. Do not use street drugs. Do not share needles. Ask your health care provider for help if you need support or information about  quitting drugs. Alcohol use Do not drink alcohol if: Your health care provider tells you not to drink. You are pregnant, may be pregnant, or are planning to become pregnant. If you drink alcohol: Limit how much you use to 0-1 drink a day. Limit intake if you are breastfeeding. Be aware of how much alcohol is in your drink. In the [place] one drink equals one 12 oz bottle of beer (355 mL), one 5 oz glass of wine (148 mL), or one 1 oz glass of hard liquor (44 mL). General instructions Schedule  regular health, dental, and eye exams. Stay current with your vaccines. Tell your health care provider if: You often feel depressed. You have ever been abused or do not feel safe at home. Summary Adopting a healthy lifestyle and getting preventive care are important in promoting health and wellness. Follow your health care provider's instructions about healthy diet, exercising, and getting tested or screened for diseases. Follow your health care provider's instructions on monitoring your cholesterol and blood pressure. This information is not intended to replace advice given to you by your health care provider. Make sure you discuss any questions you have with your healthcare provider. Document Revised: 04/16/2018 Document Reviewed: 04/16/2018 Elsevier Patient Education  2022 [employer].

## 2020-10-20 NOTE — Assessment & Plan Note (Signed)
CBE and pap smear performed. Encouraged continued exercise. Patient will schedule mammogram and colonoscopy.

## 2020-10-20 NOTE — Assessment & Plan Note (Signed)
Patient will continue weight watchers and regular exercise. Discussed black box warning regarding medullary thyroid cancer, MENs. Patient comfortable with starting wegovy.

## 2020-10-20 NOTE — Assessment & Plan Note (Signed)
Stable  Continue current regimen  

## 2020-10-20 NOTE — Assessment & Plan Note (Signed)
Chronic, unchanged. No acute findings on exam. Continue  linzess as prescribed by Dr Bonna Gains. Patient overdue for colonoscopy and will call to schedule this; she politely declines our office arranging.  Discussed miralax, colace , and metamucil for constipation prevention and management of hemorrhoids.

## 2020-10-20 NOTE — Progress Notes (Signed)
Subjective:    Patient ID: Denise Gaines, female    DOB: 1975/06/20, 45 y.o.   MRN: [id number]  CC: Denise Gaines is a 45 y.o. female who presents today for physical exam.    HPI: She complains of constipation her whole life.  Hard formed stool which is difficult to pass. BRB on outside of stool No abdominal pain, fever, melana colored stool.   1-2 BMs per week is normal for her.   Compliant linzess from Dr Bonna Gains.   She is interested in weight loss medication. She has tried phentermine in the past without long term success. She has started weight watchers and resumed exercise program.  No personal or family h/o thyroid cancer.  Follows with Dr Nicolasa Ducking, compliant with wellbutrin, trazodone, zoloft.   Colorectal Cancer Screening: done 09/2019, repeat in one year  Breast Cancer Screening: Mammogram due Cervical Cancer Screening: due Bone Health screening/DEXA for 65+: No increased fracture risk. Defer screening at this time.         Tetanus - utd       Hepatitis C screening - Candidate for, consents HIV Screening- Candidate for, declines Labs: Screening labs today. Exercise: Gets regular exercise.   Alcohol use:  none Smoking/tobacco use: Nonsmoker.     HISTORY:  Past Medical History:  Diagnosis Date   Depression    Obesity     Past Surgical History:  Procedure Laterality Date   BREAST BIOPSY Right 2017   NEG   COLONOSCOPY WITH PROPOFOL N/A 09/23/2019   Procedure: COLONOSCOPY WITH PROPOFOL;  Surgeon: Virgel Manifold, MD;  Location: [facility] END[facility];  Service: Endoscopy;  Laterality: N/A;   NASAL SINUS SURGERY  [phone]   Family History  Problem Relation Age of Onset   Mental illness Mother    Breast cancer Mother    Thyroid cancer Neg Hx       ALLERGIES: Patient has no known allergies.  Current Outpatient Medications on File Prior to Visit  Medication Sig Dispense Refill   buPROPion (WELLBUTRIN SR) 100 MG 12 hr tablet Take 100 mg by mouth 2 (two) times  daily.     levonorgestrel (MIRENA) 20 MCG/24HR IUD 1 each by Intrauterine route once.     linaclotide (LINZESS) 290 MCG CAPS capsule Take 290 mcg by mouth daily before breakfast.     sertraline (ZOLOFT) 100 MG tablet Take 1 tablet (100 mg total) by mouth daily. (Patient taking differently: Take 150 mg by mouth daily.) 30 tablet 1   traZODone (DESYREL) 100 MG tablet Take 200 mg by mouth at bedtime.     No current facility-administered medications on file prior to visit.    Social History   Tobacco Use   Smoking status: Never   Smokeless tobacco: Never  Vaping Use   Vaping Use: Never used  Substance Use Topics   Alcohol use: No   Drug use: No    Review of Systems  Constitutional:  Negative for chills, fever and unexpected weight change.  HENT:  Negative for congestion.   Respiratory:  Negative for cough.   Cardiovascular:  Negative for chest pain, palpitations and leg swelling.  Gastrointestinal:  Positive for anal bleeding and constipation. Negative for abdominal distention, abdominal pain, blood in stool, nausea and vomiting.  Musculoskeletal:  Negative for arthralgias and myalgias.  Skin:  Negative for rash.  Neurological:  Negative for headaches.  Hematological:  Negative for adenopathy.  Psychiatric/Behavioral:  Negative for confusion.      Objective:    BP 120/68  Pulse 83   Temp (!) 96.4 F (35.8 C) (Temporal)   Ht 5\' 6"  (1.676 m)   Wt 268 lb 3.2 oz (121.7 kg)   SpO2 99%   BMI 43.29 kg/m   BP Readings from Last 3 Encounters:  10/20/20 120/68  07/08/20 127/85  09/23/19 108/65   Wt Readings from Last 3 Encounters:  10/20/20 268 lb 3.2 oz (121.7 kg)  10/07/19 259 lb 4.8 oz (117.6 kg)  09/23/19 265 lb (120.2 kg)    Physical Exam Vitals reviewed.  Constitutional:      Appearance: Normal appearance. She is well-developed.  Eyes:     Conjunctiva/sclera: Conjunctivae normal.  Neck:     Thyroid: No thyroid mass or thyromegaly.  Cardiovascular:     Rate  and Rhythm: Normal rate and regular rhythm.     Pulses: Normal pulses.     Heart sounds: Normal heart sounds.  Pulmonary:     Effort: Pulmonary effort is normal.     Breath sounds: Normal breath sounds. No wheezing, rhonchi or rales.  Chest:  Breasts:    Breasts are symmetrical.     Right: No inverted nipple, mass, nipple discharge, skin change or tenderness.     Left: No inverted nipple, mass, nipple discharge, skin change or tenderness.  Abdominal:     General: Bowel sounds are normal. There is no distension.     Palpations: Abdomen is soft. Abdomen is not rigid. There is no fluid wave or mass.     Tenderness: There is no abdominal tenderness. There is no guarding or rebound.  Genitourinary:    Cervix: No cervical motion tenderness, discharge or friability.     Uterus: Not enlarged, not fixed and not tender.      Adnexa:        Right: No mass, tenderness or fullness.         Left: No mass, tenderness or fullness.       Comments: Pap performed. IUD string seen coming from cervical os. No CMT. Unable to appreciated ovaries. Skin tag noted on anus. No hemorrhoid seen with external inspection. Lymphadenopathy:     Head:     Right side of head: No submental, submandibular, tonsillar, preauricular, posterior auricular or occipital adenopathy.     Left side of head: No submental, submandibular, tonsillar, preauricular, posterior auricular or occipital adenopathy.     Cervical:     Right cervical: No superficial, deep or posterior cervical adenopathy.    Left cervical: No superficial, deep or posterior cervical adenopathy.     Upper Body:     Right upper body: No pectoral adenopathy.     Left upper body: No pectoral adenopathy.  Skin:    General: Skin is warm and dry.  Neurological:     Mental Status: She is alert.  Psychiatric:        Speech: Speech normal.        Behavior: Behavior normal.        Thought Content: Thought content normal.       Assessment & Plan:   Problem  List Items Addressed This Visit       Other   Annual physical exam - Primary    CBE and pap smear performed. Encouraged continued exercise. Patient will schedule mammogram and colonoscopy.        Constipation    Chronic, unchanged. No acute findings on exam. Continue  linzess as prescribed by Dr Bonna Gains. Patient overdue for colonoscopy and will call to schedule this;  she politely declines our office arranging.  Discussed miralax, colace , and metamucil for constipation prevention and management of hemorrhoids.        Depression    Stable. Continue current regimen.        Obesity    Patient will continue weight watchers and regular exercise. Discussed black box warning regarding medullary thyroid cancer, MENs. Patient comfortable with starting wegovy.        Relevant Medications   Semaglutide-Weight Management (WEGOVY) 0.25 MG/0.5ML SOAJ   Other Visit Diagnoses     Encounter for annual physical exam       Relevant Orders   Cytology - PAP( [place])   MM 3D SCREEN BREAST BILATERAL   TSH   Comprehensive metabolic panel   Hemoglobin A1c   CBC with Differential/Platelet   Hepatitis C antibody   Lipid panel   VITAMIN D 25 Hydroxy (Vit-D Deficiency, Fractures)        I have discontinued Kaina Lites's cyanocobalamin, fluticasone, and cephALEXin. I am also having her start on Wegovy. Additionally, I am having her maintain her levonorgestrel, sertraline, buPROPion, traZODone, and linaclotide.   Meds ordered this encounter  Medications   Semaglutide-Weight Management (WEGOVY) 0.25 MG/0.5ML SOAJ    Sig: Inject 0.25 mg into the skin once a week.    Dispense:  2 mL    Refill:  2    Order Specific Question:   Supervising Provider    Answer:   Crecencio Mc [[id number]]     Return precautions given.   Risks, benefits, and alternatives of the medications and treatment plan prescribed today were discussed, and patient expressed understanding.   Education regarding  symptom management and diagnosis given to patient on AVS.   Continue to follow with Burnard Hawthorne, FNP for routine health maintenance.   Lindzy Shisler and I agreed with plan.   Mable Paris, FNP

## 2020-10-24 ENCOUNTER — Telehealth: Payer: Self-pay

## 2020-10-24 LAB — CYTOLOGY - PAP
Comment: NEGATIVE
Diagnosis: NEGATIVE
High risk HPV: NEGATIVE

## 2020-10-24 NOTE — Telephone Encounter (Signed)
PA approved for [facility] for 10/20/20-05/22/21. It is on backorder until October. What would you think about Ozempic?

## 2020-10-26 MED ORDER — OZEMPIC (0.25 OR 0.5 MG/DOSE) 2 MG/1.5ML ~~LOC~~ SOPN: 0.2500 mg | PEN_INJECTOR | SUBCUTANEOUS | 3 refills | Status: DC

## 2020-10-26 NOTE — Telephone Encounter (Signed)
Call pt  Agree that ozempic is okay to take  I have sent  Please ensure she has f/u scheduled in 8-10 weeks approx

## 2020-10-26 NOTE — Addendum Note (Signed)
Addended by: Burnard Hawthorne on: 10/26/2020 07:50 AM   Modules accepted: Orders

## 2020-10-26 NOTE — Telephone Encounter (Signed)
Pt called and instructed how to take & that the Ozempic was sent. Pt scheduled to f/u in 8 weeks.

## 2020-11-02 ENCOUNTER — Other Ambulatory Visit: Payer: Self-pay

## 2020-11-02 ENCOUNTER — Ambulatory Visit
Admission: RE | Admit: 2020-11-02 | Discharge: 2020-11-02 | Disposition: A | Discharge status: Home / Self Care | Payer: 59 | Source: Ambulatory Visit | Attending: Family | Admitting: Family

## 2020-11-02 DIAGNOSIS — Z1231 Encounter for screening mammogram for malignant neoplasm of breast: Secondary | ICD-10-CM | POA: Insufficient documentation

## 2020-11-02 DIAGNOSIS — Z0000 Encounter for general adult medical examination without abnormal findings: Secondary | ICD-10-CM

## 2020-11-22 ENCOUNTER — Encounter: Payer: Self-pay | Admitting: Family

## 2020-11-23 ENCOUNTER — Encounter: Payer: Self-pay | Admitting: *Deleted

## 2020-11-23 ENCOUNTER — Telehealth (INDEPENDENT_AMBULATORY_CARE_PROVIDER_SITE_OTHER): Payer: Self-pay | Admitting: Gastroenterology

## 2020-11-23 DIAGNOSIS — Z86010 Personal history of colonic polyps: Secondary | ICD-10-CM

## 2020-11-23 MED ORDER — PEG 3350-KCL-NA BICARB-NACL 420 G PO SOLR: 8000.0000 mL | Freq: Once | ORAL | 0 refills | Status: AC

## 2020-11-23 NOTE — Progress Notes (Signed)
Gastroenterology Pre-Procedure Review  Request Date: 03/02/21 Requesting Physician: Dr. Bonna Gains  PATIENT REVIEW QUESTIONS: The patient responded to the following health history questions as indicated:    1. Are you having any GI issues? no 2. Do you have a personal history of Polyps? yes (09/23/2019) 3. Do you have a family history of Colon Cancer or Polyps? yes (father colon polyps) 4. Diabetes Mellitus? no 5. Joint replacements in the past 12 months?no 6. Major health problems in the past 3 months?no 7. Any artificial heart valves, MVP, or defibrillator?no    MEDICATIONS & ALLERGIES:    Patient reports the following regarding taking any anticoagulation/antiplatelet therapy:   Plavix, Coumadin, Eliquis, Xarelto, Lovenox, Pradaxa, Brilinta, or Effient? no Aspirin? no  Patient confirms/reports the following medications:  Current Outpatient Medications  Medication Sig Dispense Refill   buPROPion (WELLBUTRIN SR) 100 MG 12 hr tablet Take 100 mg by mouth 2 (two) times daily.     levonorgestrel (MIRENA) 20 MCG/24HR IUD 1 each by Intrauterine route once.     linaclotide (LINZESS) 290 MCG CAPS capsule Take 290 mcg by mouth daily before breakfast.     Semaglutide,0.25 or 0.5MG /DOS, (OZEMPIC, 0.25 OR 0.5 MG/DOSE,) 2 MG/1.5ML SOPN Inject 0.25 mg into the skin once a week. After 4 weeks, increase to 0.5mg  [place] qwk. 3 mL 3   sertraline (ZOLOFT) 100 MG tablet Take 1 tablet (100 mg total) by mouth daily. (Patient taking differently: Take 150 mg by mouth daily.) 30 tablet 1   traZODone (DESYREL) 100 MG tablet Take 200 mg by mouth at bedtime.     No current facility-administered medications for this visit.    Patient confirms/reports the following allergies:  No Known Allergies  No orders of the defined types were placed in this encounter.   AUTHORIZATION INFORMATION Primary Insurance: 1D#: Group #:  Secondary Insurance: 1D#: Group #:  SCHEDULE INFORMATION: Date:  03/02/21 Time: Location: [place]

## 2020-11-25 ENCOUNTER — Other Ambulatory Visit: Payer: Self-pay | Admitting: Family

## 2020-11-25 MED ORDER — OZEMPIC (1 MG/DOSE) 4 MG/3ML ~~LOC~~ SOPN: 1.0000 mg | PEN_INJECTOR | SUBCUTANEOUS | 1 refills | Status: DC

## 2020-12-02 ENCOUNTER — Other Ambulatory Visit: Payer: Self-pay

## 2020-12-02 MED ORDER — CARESTART COVID-19 HOME TEST VI KIT
PACK | 0 refills | Status: DC
  Filled 2020-12-02: qty 2, 4d supply, fill #0

## 2020-12-16 ENCOUNTER — Other Ambulatory Visit (INDEPENDENT_AMBULATORY_CARE_PROVIDER_SITE_OTHER): Payer: 59

## 2020-12-16 ENCOUNTER — Other Ambulatory Visit: Payer: Self-pay

## 2020-12-16 DIAGNOSIS — Z0000 Encounter for general adult medical examination without abnormal findings: Secondary | ICD-10-CM

## 2020-12-16 LAB — COMPREHENSIVE METABOLIC PANEL
ALT: 10 U/L (ref 0–35)
AST: 13 U/L (ref 0–37)
Albumin: 4 g/dL (ref 3.5–5.2)
Alkaline Phosphatase: 85 U/L (ref 39–117)
BUN: 12 mg/dL (ref 6–23)
CO2: 23 mEq/L (ref 19–32)
Calcium: 8.8 mg/dL (ref 8.4–10.5)
Chloride: 105 mEq/L (ref 96–112)
Creatinine, Ser: 0.73 mg/dL (ref 0.40–1.20)
GFR: 98.56 mL/min (ref >60.00)
Glucose, Bld: 67 mg/dL — ABNORMAL LOW (ref 70–99)
Potassium: 4.1 mEq/L (ref 3.5–5.1)
Sodium: 137 mEq/L (ref 135–145)
Total Bilirubin: 0.5 mg/dL (ref 0.2–1.2)
Total Protein: 6.1 g/dL (ref 6.0–8.3)

## 2020-12-16 LAB — LIPID PANEL
Cholesterol: 153 mg/dL (ref 0–200)
HDL: 50 mg/dL (ref >39.00)
LDL Cholesterol: 91 mg/dL (ref 0–99)
NonHDL: 102.73
Total CHOL/HDL Ratio: 3
Triglycerides: 59 mg/dL (ref 0.0–149.0)
VLDL: 11.8 mg/dL (ref 0.0–40.0)

## 2020-12-16 LAB — CBC WITH DIFFERENTIAL/PLATELET
Basophils Absolute: 0 10*3/uL (ref 0.0–0.1)
Basophils Relative: 0.4 % (ref 0.0–3.0)
Eosinophils Absolute: 0.1 10*3/uL (ref 0.0–0.7)
Eosinophils Relative: 0.8 % (ref 0.0–5.0)
HCT: 41.1 % (ref 36.0–46.0)
Hemoglobin: 13.8 g/dL (ref 12.0–15.0)
Lymphocytes Relative: 20 % (ref 12.0–46.0)
Lymphs Abs: 1.6 10*3/uL (ref 0.7–4.0)
MCHC: 33.5 g/dL (ref 30.0–36.0)
MCV: 87.6 fl (ref 78.0–100.0)
Monocytes Absolute: 0.5 10*3/uL (ref 0.1–1.0)
Monocytes Relative: 5.8 % (ref 3.0–12.0)
Neutro Abs: 5.8 10*3/uL (ref 1.4–7.7)
Neutrophils Relative %: 73 % (ref 43.0–77.0)
Platelets: 286 10*3/uL (ref 150.0–400.0)
RBC: 4.7 Mil/uL (ref 3.87–5.11)
RDW: 14.6 % (ref 11.5–15.5)
WBC: 7.9 10*3/uL (ref 4.0–10.5)

## 2020-12-16 LAB — VITAMIN D 25 HYDROXY (VIT D DEFICIENCY, FRACTURES): VITD: 33.44 ng/mL (ref 30.00–100.00)

## 2020-12-16 LAB — HEMOGLOBIN A1C: Hgb A1c MFr Bld: 5.1 % (ref 4.6–6.5)

## 2020-12-16 LAB — TSH: TSH: 1.4 u[IU]/mL (ref 0.35–5.50)

## 2020-12-19 LAB — HEPATITIS C ANTIBODY
Hepatitis C Ab: NONREACTIVE
SIGNAL TO CUT-OFF: 0 (ref <1.00)

## 2020-12-28 ENCOUNTER — Ambulatory Visit: Payer: 59 | Admitting: Family

## 2021-01-16 ENCOUNTER — Encounter: Payer: Self-pay | Admitting: Family

## 2021-01-16 ENCOUNTER — Encounter: Payer: 59 | Admitting: Family

## 2021-01-16 ENCOUNTER — Telehealth: Payer: Self-pay

## 2021-01-16 NOTE — Telephone Encounter (Signed)
I called and discuss below with patient. She stated that she does eat small meals & does not any longer feel the need to overeat. She will try going in between the two doses first then if no improvement, she will go back down to the 0.5 mg dose.

## 2021-01-16 NOTE — Telephone Encounter (Signed)
Pt had 8:30 appointment this morning, but due to [place] running behind she had to reschedule bc she herself had a patient. She was checking in after starting the Ozempic for weight loss. She sad that she has been losing about 1 lb per week & has been on the 1 mg dose for about 4 weeks now. She said that she is nauseated & when she does get hungry it hits her fast. That in itself makes her nauseated feeling. She was wondering is this normal & if it will subside then she will continue titration dose, but if not wondered if she should titrate back down? She is now 248 lbs & she does work out 3x per week as well. She is reschedule for first available 10/5.

## 2021-01-16 NOTE — Telephone Encounter (Signed)
Call patient Remind patient how ozempic empiric works.  Ozempic delays gastric emptying so the most important piece to remember is when you are eating a meal to pay  attention to clues for feeling full.  If you overeat on this medication you can feel nausea bloating and even vomiting can occur. I have seen the nausea improved however I have also seen where it doesn't  improved.  I think is very reasonable follow-up DECREASE    She can decrease to 0.'5mg'$  OR she can even just use the pen and 'click a couple of clicks' down so she is in between the '1mg'$  and 0.'5mg'$ . there are hash marks there but the doses arent  labeled.

## 2021-01-18 NOTE — Progress Notes (Signed)
This encounter was created in error - please disregard.

## 2021-02-08 ENCOUNTER — Encounter: Payer: Self-pay | Admitting: Family

## 2021-02-08 ENCOUNTER — Telehealth (INDEPENDENT_AMBULATORY_CARE_PROVIDER_SITE_OTHER): Payer: 59 | Admitting: Family

## 2021-02-08 VITALS — Ht 66.0 in | Wt 241.0 lb

## 2021-02-08 DIAGNOSIS — K5900 Constipation, unspecified: Secondary | ICD-10-CM

## 2021-02-08 DIAGNOSIS — F32A Depression, unspecified: Secondary | ICD-10-CM

## 2021-02-08 DIAGNOSIS — K219 Gastro-esophageal reflux disease without esophagitis: Secondary | ICD-10-CM | POA: Diagnosis not present

## 2021-02-08 MED ORDER — TRAZODONE HCL 100 MG PO TABS: 100.0000 mg | ORAL_TABLET | Freq: Every day | ORAL | 2 refills | Status: DC

## 2021-02-08 MED ORDER — SERTRALINE HCL 100 MG PO TABS: 200.0000 mg | ORAL_TABLET | Freq: Every day | ORAL | 1 refills | Status: DC

## 2021-02-08 MED ORDER — OZEMPIC (0.25 OR 0.5 MG/DOSE) 2 MG/1.5ML ~~LOC~~ SOPN: 0.2500 mg | PEN_INJECTOR | SUBCUTANEOUS | 3 refills | Status: DC

## 2021-02-08 MED ORDER — BUPROPION HCL 75 MG PO TABS: 150.0000 mg | ORAL_TABLET | Freq: Two times a day (BID) | ORAL | 2 refills | Status: DC

## 2021-02-08 MED ORDER — PANTOPRAZOLE SODIUM 20 MG PO TBEC: 20.0000 mg | DELAYED_RELEASE_TABLET | Freq: Every day | ORAL | 1 refills | Status: DC

## 2021-02-08 NOTE — Patient Instructions (Addendum)
STOP ozempic.  Please not resume Ozempic until symptoms have completely resolved.  If symptoms resolve and you desire you may certainly restart 2.53 mg Ozempic as we discussed  Start pantoprazole and ensure that you are taking 30 minutes to 1 hour prior to breakfast.  After couple of weeks on this medication daily, if symptoms have resolved, you may start to wean off the medication by taking every other day. Please read the below.   Long term use beyond 3 months of proton pump inhibitors ( PPI's) include Nexium, Prilosec, Protonix, Dexilant, and Prevacid.  Some medical studies have identified an association between the long-term use of PPIs and the development of numerous adverse conditions including intestinal infections, pneumonia, stomach cancer, osteoporosis-related bone fractures, chronic kidney disease, deficiencies of certain vitamins and minerals, heart attacks, strokes, dementia, and early death. Those studies have flaws, are not considered definitive, and do not establish a cause-and-effect relationship between PPIs and the adverse conditions. High-quality studies have found that PPIs do not significantly increase the risk of any of these conditions except intestinal infections. Nevertheless, we cannot exclude the possibility that PPIs might confer a small increase in the risk of developing these adverse conditions. For the treatment of GERD, gastroenterologists generally agree that the well-established benefits of PPIs far outweigh their theoretical risks.  Nonetheless I recommend trial wean off of PPI's  if uncomplicated acid reflux and use of histamine 2 blocker ( Pepcid AC).   Patients with history of esophagitis  or Barrett's esophagus should remain on PPI.   I generally recommend trying to control acid reflux with lifestyle modifications including avoiding trigger foods, not eating 2 hours prior to bedtime. You may use histamine 2 blockers daily to twice daily ( this is Pepcid) and then  when symptoms flare, start back on PPI for short course.   Of note, we will need to do an endoscopy ( upper GI) to evaluate your esophagus, stomach in the future if acid reflux persists are you develop red flag symptoms: trouble swallowing, hoarseness, chronic cough, unexplained weight loss.

## 2021-02-08 NOTE — Assessment & Plan Note (Signed)
Chronic, improved.  Patient currently taking MiraLAX in prune juice daily.  She is no longer using Linzess at this time.  Upcoming colonoscopy scheduled.

## 2021-02-08 NOTE — Assessment & Plan Note (Signed)
Chronic, stable.  Continue wellbutrin 150mg  BID, trazodone 100mg , zoloft 100mg 

## 2021-02-08 NOTE — Progress Notes (Signed)
Virtual Visit via Video Note  I connected with@  on 02/08/21 at  9:00 AM EDT by a video enabled telemedicine application and verified that I am speaking with the correct person using two identifiers.  Location patient: home Location provider:work  Persons participating in the virtual visit: patient, provider  I discussed the limitations of evaluation and management by telemedicine and the availability of in person appointments. The patient expressed understanding and agreed to proceed.   HPI:  Nausea and indigestion worse on ozempic 1mg  past couple of months.  Symptoms unchanged after she eats. She decreased ozmepic to 0.5mg  without relief.  Reports she felt well on 0.25 mg dose without nausea, indigestion.  Endorses 'firey burp' that tastes like boiled egg which is persistent even with decreased dose 0.5mg .   No abdominal pain, fever, trouble or pain with swallowing.  Occasional non bloody vomiting.   No nsaid or alcohol use.   She is diligent about not over eating on ozempic.   Last BM this morning. She is taking miralax and prune juice and feels more comfortable. She feels BM frequency and quality has improved. She is not taking linzess.   She has tried tums , Neurosurgeon without relief. Milk with relief.    She has lost weight total 27 lbs in 4 months time. She is very happy weight loss.   Colonoscopy scheduled 03/02/21  Working out daily with elliptical 30 minutes.   Anxiety and depression- compliant with wellbutrin 150mg  BID, trazodone 100mg , zoloft 100mg . Feels well controlled. "Im in a  great place'. She would like for me to take over care. She had been following with Dr Nicolasa Ducking.   No h/o seizure, anorexia or bulimia.   No si/hi.  No h/o hospitalization for mental health.  Once a month has counselor with [facility] which has been helpful.   ROS: See pertinent positives and negatives per HPI.    EXAM:  VITALS per patient if applicable: Ht 5\' 6"  (1.676 m)   Wt 241 lb  (109.3 kg)   BMI 38.90 kg/m  BP Readings from Last 3 Encounters:  10/20/20 120/68  07/08/20 127/85  09/23/19 108/65   Wt Readings from Last 3 Encounters:  02/08/21 241 lb (109.3 kg)  10/20/20 268 lb 3.2 oz (121.7 kg)  10/07/19 259 lb 4.8 oz (117.6 kg)    GENERAL: alert, oriented, appears well and in no acute distress  HEENT: atraumatic, conjunttiva clear, no obvious abnormalities on inspection of external nose and ears  NECK: normal movements of the head and neck  LUNGS: on inspection no signs of respiratory distress, breathing rate appears normal, no obvious gross SOB, gasping or wheezing  CV: no obvious cyanosis  MS: moves all visible extremities without noticeable abnormality  PSYCH/NEURO: pleasant and cooperative, no obvious depression or anxiety, speech and thought processing grossly intact  ASSESSMENT AND PLAN:  Discussed the following assessment and plan:  Problem List Items Addressed This Visit       Digestive   GERD (gastroesophageal reflux disease)    Presentation most consistent with GERD however very much question Ozempic exacerbating.  Advised first and foremost to stop Ozempic and see if symptoms improve.  In the meantime, she may start Protonix 20 mg taken before breakfast.  Advised her to wean off this medication in a couple weeks.  Close follow-up      Relevant Medications   polyethylene glycol-electrolytes (NULYTELY) 420 g solution   polyethylene glycol (MIRALAX / GLYCOLAX) 17 g packet   pantoprazole (PROTONIX)  20 MG tablet     Other   Constipation    Chronic, improved.  Patient currently taking MiraLAX in prune juice daily.  She is no longer using Linzess at this time.  Upcoming colonoscopy scheduled.       Depression - Primary    Chronic, stable.  Continue wellbutrin 150mg  BID, trazodone 100mg , zoloft 100mg       Relevant Medications   sertraline (ZOLOFT) 100 MG tablet   traZODone (DESYREL) 100 MG tablet   buPROPion (WELLBUTRIN) 75 MG tablet    Obesity    Tremendous weight loss and congratulated patient in this regard.  Concerned that she is experienced side effects with Ozempic.  We will stop Ozempic.  She did not have symptoms when she was on the 0.25 mg dose and she was able to lose weight on this dose as well.  After stopping Ozempic as long as symptoms improve, she may resume ozempic 0.25 mg.  She will let me know how she is doing      Relevant Medications   Semaglutide,0.25 or 0.5MG /DOS, (OZEMPIC, 0.25 OR 0.5 MG/DOSE,) 2 MG/1.5ML SOPN    -we discussed possible serious and likely etiologies, options for evaluation and workup, limitations of telemedicine visit vs in person visit, treatment, treatment risks and precautions. Pt prefers to treat via telemedicine empirically rather then risking or undertaking an in person visit at this moment.  .   I discussed the assessment and treatment plan with the patient. The patient was provided an opportunity to ask questions and all were answered. The patient agreed with the plan and demonstrated an understanding of the instructions.   The patient was advised to call back or seek an in-person evaluation if the symptoms worsen or if the condition fails to improve as anticipated.   Mable Paris, FNP

## 2021-02-08 NOTE — Assessment & Plan Note (Signed)
Presentation most consistent with GERD however very much question Ozempic exacerbating.  Advised first and foremost to stop Ozempic and see if symptoms improve.  In the meantime, she may start Protonix 20 mg taken before breakfast.  Advised her to wean off this medication in a couple weeks.  Close follow-up

## 2021-02-08 NOTE — Assessment & Plan Note (Signed)
Tremendous weight loss and congratulated patient in this regard.  Concerned that she is experienced side effects with Ozempic.  We will stop Ozempic.  She did not have symptoms when she was on the 0.25 mg dose and she was able to lose weight on this dose as well.  After stopping Ozempic as long as symptoms improve, she may resume ozempic 0.25 mg.  She will let me know how she is doing

## 2021-03-02 ENCOUNTER — Ambulatory Visit: Payer: 59 | Admitting: Registered Nurse

## 2021-03-02 ENCOUNTER — Encounter
Admission: RE | Disposition: A | Discharge status: Home / Self Care | Payer: Self-pay | Source: Ambulatory Visit | Attending: Gastroenterology

## 2021-03-02 ENCOUNTER — Ambulatory Visit
Admission: RE | Admit: 2021-03-02 | Discharge: 2021-03-02 | Disposition: A | Discharge status: Home / Self Care | Payer: 59 | Source: Ambulatory Visit | Attending: Gastroenterology | Admitting: Gastroenterology

## 2021-03-02 ENCOUNTER — Encounter: Payer: Self-pay | Admitting: Gastroenterology

## 2021-03-02 DIAGNOSIS — D120 Benign neoplasm of cecum: Secondary | ICD-10-CM | POA: Insufficient documentation

## 2021-03-02 DIAGNOSIS — Z793 Long term (current) use of hormonal contraceptives: Secondary | ICD-10-CM | POA: Diagnosis not present

## 2021-03-02 DIAGNOSIS — Z79899 Other long term (current) drug therapy: Secondary | ICD-10-CM | POA: Diagnosis not present

## 2021-03-02 DIAGNOSIS — K635 Polyp of colon: Secondary | ICD-10-CM | POA: Diagnosis not present

## 2021-03-02 DIAGNOSIS — K644 Residual hemorrhoidal skin tags: Secondary | ICD-10-CM | POA: Diagnosis not present

## 2021-03-02 DIAGNOSIS — Z86010 Personal history of colonic polyps: Secondary | ICD-10-CM

## 2021-03-02 DIAGNOSIS — Z09 Encounter for follow-up examination after completed treatment for conditions other than malignant neoplasm: Secondary | ICD-10-CM | POA: Diagnosis present

## 2021-03-02 DIAGNOSIS — K648 Other hemorrhoids: Secondary | ICD-10-CM | POA: Insufficient documentation

## 2021-03-02 DIAGNOSIS — Z7985 Long-term (current) use of injectable non-insulin antidiabetic drugs: Secondary | ICD-10-CM | POA: Insufficient documentation

## 2021-03-02 HISTORY — PX: COLONOSCOPY WITH PROPOFOL: SHX5780

## 2021-03-02 LAB — POCT PREGNANCY, URINE: Preg Test, Ur: NEGATIVE

## 2021-03-02 SURGERY — COLONOSCOPY WITH PROPOFOL
Anesthesia: Monitor Anesthesia Care

## 2021-03-02 MED ORDER — EPHEDRINE SULFATE 50 MG/ML IJ SOLN
INTRAMUSCULAR | Status: DC | PRN
  Administered 2021-03-02: 15 mg via INTRAVENOUS

## 2021-03-02 MED ORDER — LIDOCAINE HCL (CARDIAC) PF 100 MG/5ML IV SOSY
PREFILLED_SYRINGE | INTRAVENOUS | Status: DC | PRN
  Administered 2021-03-02: 40 mg via INTRAVENOUS

## 2021-03-02 MED ORDER — PROPOFOL 10 MG/ML IV BOLUS
INTRAVENOUS | Status: DC | PRN
  Administered 2021-03-02 (×2): 10 mg via INTRAVENOUS
  Administered 2021-03-02: 80 mg via INTRAVENOUS

## 2021-03-02 MED ORDER — PROPOFOL 500 MG/50ML IV EMUL
INTRAVENOUS | Status: DC | PRN
  Administered 2021-03-02: 150 ug/kg/min via INTRAVENOUS

## 2021-03-02 MED ORDER — PHENYLEPHRINE HCL (PRESSORS) 10 MG/ML IV SOLN
INTRAVENOUS | Status: AC
  Filled 2021-03-02: qty 2

## 2021-03-02 MED ORDER — PROPOFOL 500 MG/50ML IV EMUL
INTRAVENOUS | Status: AC
  Filled 2021-03-02: qty 50

## 2021-03-02 MED ORDER — SODIUM CHLORIDE 0.9 % IV SOLN
INTRAVENOUS | Status: DC
  Administered 2021-03-02: 1000 mL via INTRAVENOUS

## 2021-03-02 NOTE — Op Note (Signed)
[facility] Gastroenterology Patient Name: Denise Gaines Procedure Date: 03/02/2021 6:59 AM MRN: [id number] Account #: [id number] Date of Birth: [birth year] Admit Type: Outpatient Age: 45 Room: [facility] ENDO ROOM 2 Gender: Female Note Status: Finalized Instrument Name: Prentice Docker [id number] Procedure:             Colonoscopy Indications:           High risk colon cancer surveillance: Personal history                         of colonic polyps Providers:             Telesa Jeancharles B. Maximino Greenland MD, MD Referring MD:          Lyn Records. Arnett (Referring MD) Medicines:             Monitored Anesthesia Care Complications:         No immediate complications. Procedure:             Pre-Anesthesia Assessment:                        - ASA Grade Assessment: II - A patient with mild                         systemic disease.                        - Prior to the procedure, a History and Physical was                         performed, and patient medications, allergies and                         sensitivities were reviewed. The patient's tolerance                         of previous anesthesia was reviewed.                        - The risks and benefits of the procedure and the                         sedation options and risks were discussed with the                         patient. All questions were answered and informed                         consent was obtained.                        - Patient identification and proposed procedure were                         verified prior to the procedure by the physician, the                         nurse, the anesthesiologist, the anesthetist and the  technician. The procedure was verified in the                         procedure room.                        After obtaining informed consent, the colonoscope was                         passed under direct vision. Throughout the procedure,                         the  patient's blood pressure, pulse, and oxygen                         saturations were monitored continuously. The                         Colonoscope was introduced through the anus and                         advanced to the the cecum, identified by appendiceal                         orifice and ileocecal valve. The colonoscopy was                         performed with ease. The patient tolerated the                         procedure well. The quality of the bowel preparation                         was good. Findings:      The perianal exam findings include non-thrombosed external hemorrhoids.      A 4 mm polyp was found in the cecum. The polyp was flat. The polyp was       removed with a jumbo cold forceps. Resection and retrieval were complete.      The exam was otherwise without abnormality.      The rectum, sigmoid colon, descending colon, transverse colon, ascending       colon and cecum appeared normal.      Non-bleeding internal hemorrhoids were found during retroflexion. Impression:            - Non-thrombosed external hemorrhoids found on                         perianal exam.                        - One 4 mm polyp in the cecum, removed with a jumbo                         cold forceps. Resected and retrieved.                        - The examination was otherwise normal.                        - The  rectum, sigmoid colon, descending colon,                         transverse colon, ascending colon and cecum are normal.                        - Non-bleeding internal hemorrhoids. Recommendation:        - Discharge patient to home (with escort).                        - Advance diet as tolerated.                        - Continue present medications.                        - Await pathology results.                        - Repeat colonoscopy in 5 years for surveillance (Due                         to history of sessile serrated polyp in 2021).                        - The  findings and recommendations were discussed with                         the patient.                        - The findings and recommendations were discussed with                         the patient's family.                        - Return to primary care physician as previously                         scheduled.                        - High fiber diet. Procedure Code(s):     --- Professional ---                        [phone], Colonoscopy, flexible; with biopsy, single or                         multiple Diagnosis Code(s):     --- Professional ---                        Z86.010, Personal history of colonic polyps                        K63.5, Polyp of colon CPT copyright 2019 American Medical Association. All rights reserved. The codes documented in this report are preliminary and upon coder review may  be revised to meet current compliance requirements.  Melodie Bouillon, MD Michel Bickers B. Mackie Holness MD, MD 03/02/2021 8:38:01 AM This report has been signed  electronically. Number of Addenda: 0 Note Initiated On: 03/02/2021 6:59 AM Scope Withdrawal Time: 0 hours 15 minutes 43 seconds  Total Procedure Duration: 0 hours 21 minutes 31 seconds  Estimated Blood Loss:  Estimated blood loss: none.      [facility]

## 2021-03-02 NOTE — Transfer of Care (Signed)
Immediate Anesthesia Transfer of Care Note  Patient: Denise Gaines  Procedure(s) Performed: Procedure(s): COLONOSCOPY WITH PROPOFOL (N/A)  Patient Location: PACU and Endoscopy Unit  Anesthesia Type:General  Level of Consciousness: sedated  Airway & Oxygen Therapy: Patient Spontanous Breathing and Patient connected to nasal cannula oxygen  Post-op Assessment: Report given to RN and Post -op Vital signs reviewed and stable  Post vital signs: Reviewed and stable  Last Vitals:  Vitals:   03/02/21 0837 03/02/21 0838  BP: (!) 97/57   Pulse: 78   Resp: (!) 26   Temp:  (!) 35.7 C  SpO2: 629%     Complications: No apparent anesthesia complications

## 2021-03-02 NOTE — Anesthesia Procedure Notes (Signed)
Date/Time: 03/02/2021 8:05 AM Performed by: Doreen Salvage, CRNA Pre-anesthesia Checklist: Patient identified, Emergency Drugs available, Suction available and Patient being monitored Patient Re-evaluated:Patient Re-evaluated prior to induction Oxygen Delivery Method: Nasal cannula Induction Type: IV induction Dental Injury: Teeth and Oropharynx as per pre-operative assessment  Comments: Nasal cannula with etCO2 monitoring

## 2021-03-02 NOTE — Anesthesia Preprocedure Evaluation (Signed)
Anesthesia Evaluation  Patient identified by MRN, date of birth, ID band Patient awake    Reviewed: Allergy & Precautions, NPO status , Patient's Chart, lab work & pertinent test results  Airway Mallampati: II  TM Distance: >3 FB Neck ROM: full    Dental  (+) Teeth Intact   Pulmonary neg pulmonary ROS,    Pulmonary exam normal breath sounds clear to auscultation       Cardiovascular Exercise Tolerance: Good negative cardio ROS Normal cardiovascular exam Rhythm:Regular Rate:Normal     Neuro/Psych Depression negative neurological ROS  negative psych ROS   GI/Hepatic negative GI ROS, Neg liver ROS, GERD  ,  Endo/Other  negative endocrine ROS  Renal/GU negative Renal ROS  negative genitourinary   Musculoskeletal negative musculoskeletal ROS (+)   Abdominal (+) + obese,   Peds negative pediatric ROS (+)  Hematology negative hematology ROS (+)   Anesthesia Other Findings Past Medical History: No date: Depression No date: Obesity  Past Surgical History: 2017: BREAST BIOPSY; Right     Comment:  NEG 09/23/2019: COLONOSCOPY WITH PROPOFOL; N/A     Comment:  Procedure: COLONOSCOPY WITH PROPOFOL;  Surgeon:               Virgel Manifold, MD;  Location: [facility] ENDOSCOPY;                Service: Endoscopy;  Laterality: N/A; 2229,7989: NASAL SINUS SURGERY     Reproductive/Obstetrics negative OB ROS                             Anesthesia Physical Anesthesia Plan  ASA: 2  Anesthesia Plan: MAC   Post-op Pain Management:    Induction: Intravenous  PONV Risk Score and Plan: Propofol infusion and TIVA  Airway Management Planned: Nasal Cannula  Additional Equipment:   Intra-op Plan:   Post-operative Plan:   Informed Consent: I have reviewed the patients History and Physical, chart, labs and discussed the procedure including the risks, benefits and alternatives for the proposed  anesthesia with the patient or authorized representative who has indicated his/her understanding and acceptance.     Dental Advisory Given  Plan Discussed with: CRNA and Surgeon  Anesthesia Plan Comments:         Anesthesia Quick Evaluation

## 2021-03-02 NOTE — H&P (Signed)
Denise Antigua, MD [address], [place], [place], [place], [address] [address], [place], [place], [place], [address] Phone: [phone]  Fax: [phone]  Primary Care Physician:  Burnard Hawthorne, FNP   Pre-Procedure History & Physical: HPI:  Denise Gaines is a 45 y.o. female is here for a colonoscopy.   Past Medical History:  Diagnosis Date   Depression    Obesity     Past Surgical History:  Procedure Laterality Date   BREAST BIOPSY Right 2017   NEG   COLONOSCOPY WITH PROPOFOL N/A 09/23/2019   Procedure: COLONOSCOPY WITH PROPOFOL;  Surgeon: Virgel Manifold, MD;  Location: [facility] ENDOSCOPY;  Service: Endoscopy;  Laterality: N/A;   NASAL SINUS SURGERY  [phone]    Prior to Admission medications   Medication Sig Start Date End Date Taking? Authorizing Provider  buPROPion (WELLBUTRIN) 75 MG tablet Take 2 tablets (150 mg total) by mouth 2 (two) times daily. 02/08/21  Yes Burnard Hawthorne, FNP  pantoprazole (PROTONIX) 20 MG tablet Take 1 tablet (20 mg total) by mouth daily. For 2 weeks 02/08/21  Yes Arnett, Yvetta Coder, FNP  Semaglutide,0.25 or 0.5MG /DOS, (OZEMPIC, 0.25 OR 0.5 MG/DOSE,) 2 MG/1.5ML SOPN Inject 0.25 mg into the skin once a week. 02/08/21  Yes Burnard Hawthorne, FNP  sertraline (ZOLOFT) 100 MG tablet Take 2 tablets (200 mg total) by mouth daily. 02/08/21  Yes Burnard Hawthorne, FNP  traZODone (DESYREL) 100 MG tablet Take 1 tablet (100 mg total) by mouth at bedtime. 02/08/21  Yes Burnard Hawthorne, FNP  levonorgestrel (MIRENA) 20 MCG/24HR IUD 1 each by Intrauterine route once.    [provider]  linaclotide Rolan Lipa) 290 MCG CAPS capsule Take 290 mcg by mouth daily before breakfast. Patient not taking: Reported on 02/08/2021    [provider]  polyethylene glycol (MIRALAX / GLYCOLAX) 17 g packet Take 17 g by mouth daily as needed.    [provider]  polyethylene glycol-electrolytes (NULYTELY) 420 g solution Take by  mouth. Patient not taking: Reported on 02/08/2021 11/23/20   [provider]    Allergies as of 11/23/2020   (No Known Allergies)    Family History  Problem Relation Age of Onset   Mental illness Mother    Breast cancer Mother 77   Thyroid cancer Neg Hx     Social History   Socioeconomic History   Marital status: Married    Spouse name: Not on file   Number of children: Not on file   Years of education: Not on file   Highest education level: Not on file  Occupational History   Not on file  Tobacco Use   Smoking status: Never   Smokeless tobacco: Never  Vaping Use   Vaping Use: Never used  Substance and Sexual Activity   Alcohol use: No   Drug use: No   Sexual activity: Yes    Birth control/protection: I.U.D.    Comment: mirena  Other Topics Concern   Not on file  Social History Narrative   Speech therapy [employer]   Son   Social Determinants of Health   Financial Resource Strain: Not on file  Food Insecurity: Not on file  Transportation Needs: Not on file  Physical Activity: Not on file  Stress: Not on file  Social Connections: Not on file  Intimate Partner Violence: Not on file    Review of Systems: See HPI, otherwise negative ROS  Physical Exam: Constitutional: General:   Alert,  Well-developed, well-nourished, pleasant  and cooperative in NAD BP (!) 146/79   Pulse (!) 59   Temp (!) 97.4 F (36.3 C) (Temporal)   Resp 18   Ht 5\' 6"  (1.676 m)   Wt 112.3 kg   SpO2 100%   BMI 39.95 kg/m   Head: Normocephalic, atraumatic.   Eyes:  Sclera clear, no icterus.   Conjunctiva pink.   Mouth:  No deformity or lesions, oropharynx pink & moist.  Neck:  Supple, trachea midline  Respiratory: Normal respiratory effort  Gastrointestinal:  Soft, non-tender and non-distended without masses, hepatosplenomegaly or hernias noted.  No guarding or rebound tenderness.     Cardiac: No clubbing or edema.  No cyanosis. Normal posterior tibial pedal pulses  noted.  Lymphatic:  No significant cervical adenopathy.  Psych:  Alert and cooperative. Normal mood and affect.  Musculoskeletal:   Symmetrical without gross deformities. 5/5 Lower extremity strength bilaterally.  Skin: Warm. Intact without significant lesions or rashes. No jaundice.  Neurologic:  Face symmetrical, tongue midline, Normal sensation to touch;  grossly normal neurologically.  Psych:  Alert and oriented x3, Alert and cooperative. Normal mood and affect.  Impression/Plan: Denise Gaines is here for a colonoscopy to be performed for history of sessile serrated polyp and fair prep on last exam  Risks, benefits, limitations, and alternatives regarding  colonoscopy have been reviewed with the patient.  Questions have been answered.  All parties agreeable.   Virgel Manifold, MD  03/02/2021, 8:03 AM

## 2021-03-02 NOTE — Anesthesia Postprocedure Evaluation (Signed)
Anesthesia Post Note  Patient: Denise Gaines  Procedure(s) Performed: COLONOSCOPY WITH PROPOFOL  Patient location during evaluation: PACU Anesthesia Type: MAC Level of consciousness: awake and oriented Pain management: pain level controlled Vital Signs Assessment: post-procedure vital signs reviewed and stable Respiratory status: spontaneous breathing and respiratory function stable Cardiovascular status: blood pressure returned to baseline Anesthetic complications: no   No notable events documented.   Last Vitals:  Vitals:   03/02/21 0915 03/02/21 0927  BP:  123/73  Pulse:    Resp:    Temp: (!) 35.6 C   SpO2:      Last Pain:  Vitals:   03/02/21 0941  TempSrc:   PainSc: 0-No pain                 VAN STAVEREN,Nyasia Baxley

## 2021-03-03 LAB — SURGICAL PATHOLOGY

## 2021-03-07 ENCOUNTER — Encounter: Payer: Self-pay | Admitting: Gastroenterology

## 2021-03-22 ENCOUNTER — Other Ambulatory Visit: Payer: Self-pay | Admitting: Family

## 2021-03-22 DIAGNOSIS — F32A Depression, unspecified: Secondary | ICD-10-CM

## 2021-04-01 ENCOUNTER — Other Ambulatory Visit: Payer: Self-pay | Admitting: Family

## 2021-04-01 DIAGNOSIS — K219 Gastro-esophageal reflux disease without esophagitis: Secondary | ICD-10-CM

## 2021-04-25 ENCOUNTER — Other Ambulatory Visit: Payer: Self-pay | Admitting: Family

## 2021-04-25 DIAGNOSIS — K219 Gastro-esophageal reflux disease without esophagitis: Secondary | ICD-10-CM

## 2021-05-27 ENCOUNTER — Telehealth (INDEPENDENT_AMBULATORY_CARE_PROVIDER_SITE_OTHER): Payer: 59 | Admitting: Nurse Practitioner

## 2021-05-27 DIAGNOSIS — J0100 Acute maxillary sinusitis, unspecified: Secondary | ICD-10-CM

## 2021-05-27 MED ORDER — AMOXICILLIN-POT CLAVULANATE 875-125 MG PO TABS: 1.0000 | ORAL_TABLET | Freq: Two times a day (BID) | ORAL | 0 refills | Status: DC

## 2021-05-27 NOTE — Progress Notes (Signed)

## 2021-09-11 ENCOUNTER — Other Ambulatory Visit: Payer: Self-pay | Admitting: Family

## 2021-09-11 DIAGNOSIS — F32A Depression, unspecified: Secondary | ICD-10-CM

## 2021-10-24 ENCOUNTER — Telehealth: Payer: Self-pay | Admitting: Family

## 2021-10-24 DIAGNOSIS — Z780 Asymptomatic menopausal state: Secondary | ICD-10-CM

## 2021-10-24 NOTE — Telephone Encounter (Signed)
Pt called stating she want a mammogram referral and blood work done before her cpe. Pt want to know about blood test to see if she is going through menopause

## 2021-10-25 ENCOUNTER — Other Ambulatory Visit: Payer: Self-pay

## 2021-11-01 ENCOUNTER — Other Ambulatory Visit: Payer: Self-pay

## 2021-11-01 ENCOUNTER — Telehealth: Payer: Self-pay

## 2021-11-01 DIAGNOSIS — Z0000 Encounter for general adult medical examination without abnormal findings: Secondary | ICD-10-CM

## 2021-11-01 NOTE — Telephone Encounter (Signed)
LVM to call back to inform patient that Labs and mammogram ordered and she needs to call [facility] to schedule apppointment

## 2021-11-01 NOTE — Telephone Encounter (Signed)
LVM to call back to inform patient that Labs and mammogram ordered and she needs to call [facility] to schedule apppointment

## 2021-11-01 NOTE — Telephone Encounter (Signed)
Jenate Please order mammogram and customary physical labs.  I have gone ahead and ordered menopausal labs   please schedule  labs and ask pt to call [facility] to sch mammogram

## 2021-11-01 NOTE — Progress Notes (Signed)
Labs and mammogram ordered.

## 2021-11-02 NOTE — Telephone Encounter (Signed)
NOTED

## 2021-11-02 NOTE — Telephone Encounter (Signed)
Pt called back and I read the message to patient. Pt has made a lab appointment and she will call [facility] to make an appointment

## 2021-11-10 ENCOUNTER — Other Ambulatory Visit (INDEPENDENT_AMBULATORY_CARE_PROVIDER_SITE_OTHER): Payer: 59

## 2021-11-10 DIAGNOSIS — Z0000 Encounter for general adult medical examination without abnormal findings: Secondary | ICD-10-CM | POA: Diagnosis not present

## 2021-11-10 LAB — CBC WITH DIFFERENTIAL/PLATELET
Basophils Absolute: 0 10*3/uL (ref 0.0–0.1)
Basophils Relative: 0.7 % (ref 0.0–3.0)
Eosinophils Absolute: 0.1 10*3/uL (ref 0.0–0.7)
Eosinophils Relative: 1.6 % (ref 0.0–5.0)
HCT: 42.2 % (ref 36.0–46.0)
Hemoglobin: 14 g/dL (ref 12.0–15.0)
Lymphocytes Relative: 25.9 % (ref 12.0–46.0)
Lymphs Abs: 1.7 10*3/uL (ref 0.7–4.0)
MCHC: 33.2 g/dL (ref 30.0–36.0)
MCV: 89.8 fl (ref 78.0–100.0)
Monocytes Absolute: 0.5 10*3/uL (ref 0.1–1.0)
Monocytes Relative: 7.7 % (ref 3.0–12.0)
Neutro Abs: 4.3 10*3/uL (ref 1.4–7.7)
Neutrophils Relative %: 64.1 % (ref 43.0–77.0)
Platelets: 292 10*3/uL (ref 150.0–400.0)
RBC: 4.7 Mil/uL (ref 3.87–5.11)
RDW: 13.8 % (ref 11.5–15.5)
WBC: 6.6 10*3/uL (ref 4.0–10.5)

## 2021-11-10 LAB — VITAMIN D 25 HYDROXY (VIT D DEFICIENCY, FRACTURES): VITD: 41.22 ng/mL (ref 30.00–100.00)

## 2021-11-10 LAB — COMPREHENSIVE METABOLIC PANEL
ALT: 12 U/L (ref 0–35)
AST: 15 U/L (ref 0–37)
Albumin: 4.1 g/dL (ref 3.5–5.2)
Alkaline Phosphatase: 76 U/L (ref 39–117)
BUN: 10 mg/dL (ref 6–23)
CO2: 22 mEq/L (ref 19–32)
Calcium: 9 mg/dL (ref 8.4–10.5)
Chloride: 107 mEq/L (ref 96–112)
Creatinine, Ser: 0.85 mg/dL (ref 0.40–1.20)
GFR: 81.59 mL/min (ref >60.00)
Glucose, Bld: 80 mg/dL (ref 70–99)
Potassium: 4.4 mEq/L (ref 3.5–5.1)
Sodium: 137 mEq/L (ref 135–145)
Total Bilirubin: 0.4 mg/dL (ref 0.2–1.2)
Total Protein: 6.6 g/dL (ref 6.0–8.3)

## 2021-11-10 LAB — HEMOGLOBIN A1C: Hgb A1c MFr Bld: 5 % (ref 4.6–6.5)

## 2021-11-10 LAB — TSH: TSH: 1.68 u[IU]/mL (ref 0.35–5.50)

## 2021-11-30 ENCOUNTER — Encounter: Payer: Self-pay | Admitting: Family

## 2021-11-30 ENCOUNTER — Other Ambulatory Visit: Payer: Self-pay

## 2021-11-30 ENCOUNTER — Encounter: Payer: Self-pay | Admitting: Emergency Medicine

## 2021-11-30 ENCOUNTER — Ambulatory Visit
Admission: EM | Admit: 2021-11-30 | Discharge: 2021-11-30 | Disposition: A | Discharge status: Home / Self Care | Payer: 59 | Attending: Emergency Medicine | Admitting: Emergency Medicine

## 2021-11-30 DIAGNOSIS — J0100 Acute maxillary sinusitis, unspecified: Secondary | ICD-10-CM

## 2021-11-30 MED ORDER — AMOXICILLIN-POT CLAVULANATE 875-125 MG PO TABS: 1.0000 | ORAL_TABLET | Freq: Two times a day (BID) | ORAL | 0 refills | Status: AC

## 2021-11-30 NOTE — ED Provider Notes (Signed)
[facility]    CSN: [id number] Arrival date & time: 11/30/21  1532      History   Chief Complaint Chief Complaint  Patient presents with   Ear Fullness    HPI Denise Gaines is a 46 y.o. female.   Patient presents with left-sided ear fullness, nasal congestion and left-sided sinus pressure for 10 days.  No known sick contacts.  Tolerating food and liquids.  Has attempted use of pseudoephedrine which has been moderately helpful.  Denies fever, chills, body aches, sore throat, coughing, shortness of breath or wheezing.  No pertinent medical history.  Past Medical History:  Diagnosis Date   Depression    Obesity     Patient Active Problem List   Diagnosis Date Noted   History of colonic polyps    Cecal polyp    GERD (gastroesophageal reflux disease) 02/08/2021   Annual physical exam 10/20/2020   Screening for colon cancer    Polyp of colon    Constipation 07/27/2019   Sleep disturbance 11/12/2014   Depression 09/27/2014   Obesity 09/27/2014    Past Surgical History:  Procedure Laterality Date   BREAST BIOPSY Right 2017   NEG   COLONOSCOPY WITH PROPOFOL N/A 09/23/2019   Procedure: COLONOSCOPY WITH PROPOFOL;  Surgeon: Virgel Manifold, MD;  Location: [facility] ENDOSCOPY;  Service: Endoscopy;  Laterality: N/A;   COLONOSCOPY WITH PROPOFOL N/A 03/02/2021   Procedure: COLONOSCOPY WITH PROPOFOL;  Surgeon: Virgel Manifold, MD;  Location: [facility] ENDOSCOPY;  Service: Endoscopy;  Laterality: N/A;   NASAL SINUS SURGERY  1993,2009    OB History     Gravida  2   Para  2   Term  2   Preterm      AB      Living  2      SAB      IAB      Ectopic      Multiple      Live Births  2            Home Medications    Prior to Admission medications   Medication Sig Start Date End Date Taking? Authorizing Provider  amoxicillin-clavulanate (AUGMENTIN) 875-125 MG tablet Take 1 tablet by mouth every 12 (twelve) hours for 10 days. 11/30/21 12/10/21 Yes  Jameah Rouser, Leitha Schuller, NP  buPROPion (WELLBUTRIN) 75 MG tablet Take 2 tablets (150 mg total) by mouth 2 (two) times daily. 02/08/21  Yes Arnett, Yvetta Coder, FNP  Semaglutide,0.25 or 0.'5MG'$ /DOS, (OZEMPIC, 0.25 OR 0.5 MG/DOSE,) 2 MG/1.5ML SOPN Inject 0.25 mg into the skin once a week. 02/08/21  Yes Burnard Hawthorne, FNP  sertraline (ZOLOFT) 100 MG tablet TAKE 2 TABLETS BY MOUTH EVERY DAY 09/11/21  Yes Burnard Hawthorne, FNP  traZODone (DESYREL) 100 MG tablet Take 1 tablet (100 mg total) by mouth at bedtime. 02/08/21  Yes Burnard Hawthorne, FNP  levonorgestrel (MIRENA) 20 MCG/24HR IUD 1 each by Intrauterine route once.    [provider]  linaclotide Rolan Lipa) 290 MCG CAPS capsule Take 290 mcg by mouth daily before breakfast. Patient not taking: Reported on 02/08/2021    [provider]  polyethylene glycol (MIRALAX / GLYCOLAX) 17 g packet Take 17 g by mouth daily as needed.    [provider]  polyethylene glycol-electrolytes (NULYTELY) 420 g solution Take by mouth. Patient not taking: Reported on 02/08/2021 11/23/20   [provider]    Family History Family History  Problem Relation Age of Onset   Mental  illness Mother    Breast cancer Mother 65   Thyroid cancer Neg Hx     Social History Social History   Tobacco Use   Smoking status: Never   Smokeless tobacco: Never  Vaping Use   Vaping Use: Never used  Substance Use Topics   Alcohol use: No   Drug use: No     Allergies   Patient has no known allergies.   Review of Systems Review of Systems Defer to HPI  Physical Exam Triage Vital Signs ED Triage Vitals  Enc Vitals Group     BP 11/30/21 1536 118/81     Pulse Rate 11/30/21 1536 65     Resp 11/30/21 1536 16     Temp 11/30/21 1536 98 F (36.7 C)     Temp Source 11/30/21 1536 Oral     SpO2 --      Weight --      Height --      Head Circumference --      Peak Flow --      Pain Score 11/30/21 1540 5     Pain Loc --      Pain Edu? --       Excl. in [place]? --    No data found.  Updated Vital Signs BP 118/81 (BP Location: Left Arm)   Pulse 65   Temp 98 F (36.7 C) (Oral)   Resp 16   LMP  (LMP Unknown)   Visual Acuity Right Eye Distance:   Left Eye Distance:   Bilateral Distance:    Right Eye Near:   Left Eye Near:    Bilateral Near:     Physical Exam Constitutional:      Appearance: Normal appearance.  HENT:     Head: Normocephalic.     Right Ear: Tympanic membrane, ear canal and external ear normal.     Left Ear: Tympanic membrane, ear canal and external ear normal.     Nose: Congestion present. No rhinorrhea.     Right Sinus: No maxillary sinus tenderness or frontal sinus tenderness.     Left Sinus: Maxillary sinus tenderness present. No frontal sinus tenderness.     Mouth/Throat:     Mouth: Mucous membranes are moist.     Pharynx: Posterior oropharyngeal erythema present.     Tonsils: No tonsillar exudate. 0 on the right. 0 on the left.  Cardiovascular:     Rate and Rhythm: Normal rate and regular rhythm.     Pulses: Normal pulses.     Heart sounds: Normal heart sounds.  Pulmonary:     Effort: Pulmonary effort is normal.     Breath sounds: Normal breath sounds.  Neurological:     Mental Status: She is alert and oriented to person, place, and time. Mental status is at baseline.  Psychiatric:        Mood and Affect: Mood normal.        Behavior: Behavior normal.      UC Treatments / Results  Labs (all labs ordered are listed, but only abnormal results are displayed) Labs Reviewed - No data to display  EKG   Radiology No results found.  Procedures Procedures (including critical care time)  Medications Ordered in UC Medications - No data to display  Initial Impression / Assessment and Plan / UC Course  I have reviewed the triage vital signs and the nursing notes.  Pertinent labs & imaging results that were available during my care of the patient were reviewed  by me and considered in my  medical decision making (see chart for details).  Acute nonrecurrent maxillary sinusitis  Vital signs are stable, patient patient is in no signs of distress, symptomology and presentation is consistent with a sinus infection as symptoms have been present for 10 days we will provide bacterial coverage, Augmentin 10-day course prescribed, may continue use of pseudoephedrine if helpful, also recommended additional Flonase for management, may attempt additional over-the-counter medication supportive measures as needed with follow-up with urgent care as needed Final Clinical Impressions(s) / UC Diagnoses   Final diagnoses:  Acute non-recurrent maxillary sinusitis     Discharge Instructions      Today you are being treated for a sinus infection, there is currently no abnormalities to your however there is tenderness along the left sinus most likely is congested with mucus leading to your ear pain  Begin use of Augmentin every morning and every evening for the next 10 days, ideally you begin to see improvement in the next 24 to 48 hours and steady progression from your  I would recommend use of Flonase every morning, this medication is a steroid nasal spray which helps to loosen secretions out of the sinus passageway as well as to reduce the amount of secretions present  may attempt the following  May attempt use of saline irrigation or Nettie pot sinus pressure  Over-the-counter Tylenol or ibuprofen every 6 hours for general pain  May hold warm compresses to the external ear to manage discomfort  May take Mucinex, this medication helps to thin secretions allowing them to drain  May take an antihistamine such as Claritin or Zyrtec, this medication reduces the amount of secretions that the body will produce  you may follow-up with urgent care as needed for persisting symptoms    ED Prescriptions     Medication Sig Dispense Auth. Provider   amoxicillin-clavulanate (AUGMENTIN) 875-125  MG tablet Take 1 tablet by mouth every 12 (twelve) hours for 10 days. 20 tablet Hans Eden, NP      PDMP not reviewed this encounter.   Hans Eden, [place] 11/30/21 [phone]

## 2021-11-30 NOTE — ED Triage Notes (Signed)
Patient c/o LFT ear fullness x 10 days.   Patient denies fever. Patient denies headache.   Patient endorses ear pain at times.   Patient endorses nasal drainage. Patient endorses LFT sided sinus pressure.   Patient has used Sudafed with no relief of symptoms.

## 2021-11-30 NOTE — Discharge Instructions (Signed)
Today you are being treated for a sinus infection, there is currently no abnormalities to your however there is tenderness along the left sinus most likely is congested with mucus leading to your ear pain  Begin use of Augmentin every morning and every evening for the next 10 days, ideally you begin to see improvement in the next 24 to 48 hours and steady progression from your  I would recommend use of Flonase every morning, this medication is a steroid nasal spray which helps to loosen secretions out of the sinus passageway as well as to reduce the amount of secretions present  may attempt the following  May attempt use of saline irrigation or Nettie pot sinus pressure  Over-the-counter Tylenol or ibuprofen every 6 hours for general pain  May hold warm compresses to the external ear to manage discomfort  May take Mucinex, this medication helps to thin secretions allowing them to drain  May take an antihistamine such as Claritin or Zyrtec, this medication reduces the amount of secretions that the body will produce  you may follow-up with urgent care as needed for persisting symptoms

## 2021-12-01 ENCOUNTER — Ambulatory Visit
Admission: RE | Admit: 2021-12-01 | Discharge: 2021-12-01 | Disposition: A | Discharge status: Home / Self Care | Payer: 59 | Source: Ambulatory Visit | Attending: Family | Admitting: Family

## 2021-12-01 ENCOUNTER — Other Ambulatory Visit: Payer: Self-pay | Admitting: Family

## 2021-12-01 DIAGNOSIS — Z1231 Encounter for screening mammogram for malignant neoplasm of breast: Secondary | ICD-10-CM | POA: Diagnosis not present

## 2021-12-01 DIAGNOSIS — Z0000 Encounter for general adult medical examination without abnormal findings: Secondary | ICD-10-CM | POA: Diagnosis present

## 2021-12-01 MED ORDER — SEMAGLUTIDE (2 MG/DOSE) 8 MG/3ML ~~LOC~~ SOPN: 2.0000 mg | PEN_INJECTOR | SUBCUTANEOUS | 1 refills | Status: DC

## 2021-12-04 ENCOUNTER — Telehealth: Payer: 59 | Admitting: Physician Assistant

## 2021-12-04 DIAGNOSIS — B9689 Other specified bacterial agents as the cause of diseases classified elsewhere: Secondary | ICD-10-CM

## 2021-12-04 DIAGNOSIS — J019 Acute sinusitis, unspecified: Secondary | ICD-10-CM | POA: Diagnosis not present

## 2021-12-04 MED ORDER — METHYLPREDNISOLONE 4 MG PO TBPK
ORAL_TABLET | ORAL | 0 refills | Status: DC
Start: 2021-12-04 — End: 2021-12-15

## 2021-12-04 NOTE — Progress Notes (Signed)
E-Visit for Sinus Problems  We are sorry that you are not feeling well.  Here is how we plan to help!  Based on what you have shared with me it looks like you have sinusitis.  Sinusitis is inflammation and infection in the sinus cavities of the head.  Based on your presentation I believe you most likely have Acute Bacterial Sinusitis.  This is an infection caused by bacteria and is treated with antibiotics. I have prescribed Medrol dose pack. Continue Augmentin and Flonase.  You may use an oral decongestant such as Mucinex D or if you have glaucoma or high blood pressure use plain Mucinex. Saline nasal spray help and can safely be used as often as needed for congestion.  If you develop worsening sinus pain, fever or notice severe headache and vision changes, or if symptoms are not better after completion of antibiotic, please schedule an appointment with a health care provider.    Sinus infections are not as easily transmitted as other respiratory infection, however we still recommend that you avoid close contact with loved ones, especially the very young and elderly.  Remember to wash your hands thoroughly throughout the day as this is the number one way to prevent the spread of infection!  Home Care: Only take medications as instructed by your medical team. Complete the entire course of an antibiotic. Do not take these medications with alcohol. A steam or ultrasonic humidifier can help congestion.  You can place a towel over your head and breathe in the steam from hot water coming from a faucet. Avoid close contacts especially the very young and the elderly. Cover your mouth when you cough or sneeze. Always remember to wash your hands.  Get Help Right Away If: You develop worsening fever or sinus pain. You develop a severe head ache or visual changes. Your symptoms persist after you have completed your treatment plan.  Make sure you Understand these instructions. Will watch your  condition. Will get help right away if you are not doing well or get worse.  Thank you for choosing an e-visit.  Your e-visit answers were reviewed by a board certified advanced clinical practitioner to complete your personal care plan. Depending upon the condition, your plan could have included both over the counter or prescription medications.  Please review your pharmacy choice. Make sure the pharmacy is open so you can pick up prescription now. If there is a problem, you may contact your provider through [employer] and have the prescription routed to another pharmacy.  Your safety is important to [place]. If you have drug allergies check your prescription carefully.   For the next 24 hours you can use MyChart to ask questions about today's visit, request a non-urgent call back, or ask for a work or school excuse. You will get an email in the next two days asking about your experience. I hope that your e-visit has been valuable and will speed your recovery.  I provided 5 minutes of non face-to-face time during this encounter for chart review and documentation.

## 2021-12-05 ENCOUNTER — Encounter: Payer: Self-pay | Admitting: Family

## 2021-12-05 ENCOUNTER — Other Ambulatory Visit: Payer: Self-pay | Admitting: Family

## 2021-12-05 ENCOUNTER — Other Ambulatory Visit: Payer: Self-pay

## 2021-12-05 MED ORDER — SEMAGLUTIDE (1 MG/DOSE) 4 MG/3ML ~~LOC~~ SOPN: 1.0000 mg | PEN_INJECTOR | SUBCUTANEOUS | 0 refills | Status: DC

## 2021-12-15 ENCOUNTER — Encounter: Payer: Self-pay | Admitting: Family

## 2021-12-15 ENCOUNTER — Other Ambulatory Visit: Payer: Self-pay

## 2021-12-15 ENCOUNTER — Ambulatory Visit (INDEPENDENT_AMBULATORY_CARE_PROVIDER_SITE_OTHER): Payer: 59 | Admitting: Family

## 2021-12-15 VITALS — BP 118/80 | HR 71 | Temp 97.8°F | Ht 66.0 in | Wt 217.8 lb

## 2021-12-15 DIAGNOSIS — F32A Depression, unspecified: Secondary | ICD-10-CM

## 2021-12-15 DIAGNOSIS — N951 Menopausal and female climacteric states: Secondary | ICD-10-CM | POA: Diagnosis not present

## 2021-12-15 DIAGNOSIS — Z0000 Encounter for general adult medical examination without abnormal findings: Secondary | ICD-10-CM

## 2021-12-15 MED ORDER — SEMAGLUTIDE (1 MG/DOSE) 4 MG/3ML ~~LOC~~ SOPN
1.0000 mg | PEN_INJECTOR | SUBCUTANEOUS | 0 refills | Status: DC
  Filled 2021-12-15: qty 3, 28d supply, fill #0

## 2021-12-15 MED ORDER — BUPROPION HCL ER (XL) 300 MG PO TB24
300.0000 mg | ORAL_TABLET | Freq: Every morning | ORAL | 2 refills | Status: DC
  Filled 2021-12-15: qty 30, 30d supply, fill #0
  Filled 2022-01-05: qty 30, 30d supply, fill #1
  Filled 2022-02-01: qty 30, 30d supply, fill #2
  Filled 2022-03-05: qty 30, 30d supply, fill #3

## 2021-12-15 MED ORDER — SEMAGLUTIDE (2 MG/DOSE) 8 MG/3ML ~~LOC~~ SOPN
2.0000 mg | PEN_INJECTOR | SUBCUTANEOUS | 3 refills | Status: DC
  Filled 2021-12-15: qty 3, 28d supply, fill #0
  Filled 2022-01-04: qty 3, 28d supply, fill #1
  Filled 2022-02-01: qty 3, 28d supply, fill #2
  Filled 2022-03-05: qty 3, 28d supply, fill #3
  Filled 2022-03-28: qty 3, 28d supply, fill #4
  Filled 2022-04-26: qty 3, 28d supply, fill #5
  Filled 2022-05-28: qty 3, 28d supply, fill #6

## 2021-12-15 MED ORDER — PAROXETINE HCL 10 MG PO TABS
10.0000 mg | ORAL_TABLET | Freq: Every day | ORAL | 3 refills | Status: DC
  Filled 2021-12-15: qty 30, 30d supply, fill #0
  Filled 2022-01-05: qty 30, 30d supply, fill #1

## 2021-12-15 NOTE — Patient Instructions (Addendum)
Start zoloft '150mg'$  for 3-4 days, then start '100mg'$  for 3-4 days, and then start '50mg'$  for 3-4 days and after that, stop zoloft and start paxil 10 mg.   If in a couple of weeks, if you want to increase to paxil '20mg'$  , that is perfectly fine- just let me know :)  Lets continue to pay attention to Wellbutrin as Wellbutrin can cause increased sweatiness, irritability.  It may be in the future we could consider decreasing the dose.  Just something to consider.  Always a pleasure to see you!  Health Maintenance for Postmenopausal Women Menopause is a normal process in which your ability to get pregnant comes to an end. This process happens slowly over many months or years, usually between the ages of 10 and 66. Menopause is complete when you have missed your menstrual period for 12 months. It is important to talk with your health care provider about some of the most common conditions that affect women after menopause (postmenopausal women). These include heart disease, cancer, and bone loss (osteoporosis). Adopting a healthy lifestyle and getting preventive care can help to promote your health and wellness. The actions you take can also lower your chances of developing some of these common conditions. What are the signs and symptoms of menopause? During menopause, you may have the following symptoms: Hot flashes. These can be moderate or severe. Night sweats. Decrease in sex drive. Mood swings. Headaches. Tiredness (fatigue). Irritability. Memory problems. Problems falling asleep or staying asleep. Talk with your health care provider about treatment options for your symptoms. Do I need hormone replacement therapy? Hormone replacement therapy is effective in treating symptoms that are caused by menopause, such as hot flashes and night sweats. Hormone replacement carries certain risks, especially as you become older. If you are thinking about using estrogen or estrogen with progestin, discuss the  benefits and risks with your health care provider. How can I reduce my risk for heart disease and stroke? The risk of heart disease, heart attack, and stroke increases as you age. One of the causes may be a change in the body's hormones during menopause. This can affect how your body uses dietary fats, triglycerides, and cholesterol. Heart attack and stroke are medical emergencies. There are many things that you can do to help prevent heart disease and stroke. Watch your blood pressure High blood pressure causes heart disease and increases the risk of stroke. This is more likely to develop in people who have high blood pressure readings or are overweight. Have your blood pressure checked: Every 3-5 years if you are 54-28 years of age. Every year if you are 87 years old or older. Eat a healthy diet  Eat a diet that includes plenty of vegetables, fruits, low-fat dairy products, and lean protein. Do not eat a lot of foods that are high in solid fats, added sugars, or sodium. Get regular exercise Get regular exercise. This is one of the most important things you can do for your health. Most adults should: Try to exercise for at least 150 minutes each week. The exercise should increase your heart rate and make you sweat (moderate-intensity exercise). Try to do strengthening exercises at least twice each week. Do these in addition to the moderate-intensity exercise. Spend less time sitting. Even light physical activity can be beneficial. Other tips Work with your health care provider to achieve or maintain a healthy weight. Do not use any products that contain nicotine or tobacco. These products include cigarettes, chewing tobacco, and  vaping devices, such as e-cigarettes. If you need help quitting, ask your health care provider. Know your numbers. Ask your health care provider to check your cholesterol and your blood sugar (glucose). Continue to have your blood tested as directed by your health care  provider. Do I need screening for cancer? Depending on your health history and family history, you may need to have cancer screenings at different stages of your life. This may include screening for: Breast cancer. Cervical cancer. Lung cancer. Colorectal cancer. What is my risk for osteoporosis? After menopause, you may be at increased risk for osteoporosis. Osteoporosis is a condition in which bone destruction happens more quickly than new bone creation. To help prevent osteoporosis or the bone fractures that can happen because of osteoporosis, you may take the following actions: If you are 40-40 years old, get at least 1,000 mg of calcium and at least 600 international units (IU) of vitamin D per day. If you are older than age 38 but younger than age 72, get at least 1,200 mg of calcium and at least 600 international units (IU) of vitamin D per day. If you are older than age 15, get at least 1,200 mg of calcium and at least 800 international units (IU) of vitamin D per day. Smoking and drinking excessive alcohol increase the risk of osteoporosis. Eat foods that are rich in calcium and vitamin D, and do weight-bearing exercises several times each week as directed by your health care provider. How does menopause affect my mental health? Depression may occur at any age, but it is more common as you become older. Common symptoms of depression include: Feeling depressed. Changes in sleep patterns. Changes in appetite or eating patterns. Feeling an overall lack of motivation or enjoyment of activities that you previously enjoyed. Frequent crying spells. Talk with your health care provider if you think that you are experiencing any of these symptoms. General instructions See your health care provider for regular wellness exams and vaccines. This may include: Scheduling regular health, dental, and eye exams. Getting and maintaining your vaccines. These include: Influenza vaccine. Get this vaccine  each year before the flu season begins. Pneumonia vaccine. Shingles vaccine. Tetanus, diphtheria, and pertussis (Tdap) booster vaccine. Your health care provider may also recommend other immunizations. Tell your health care provider if you have ever been abused or do not feel safe at home. Summary Menopause is a normal process in which your ability to get pregnant comes to an end. This condition causes hot flashes, night sweats, decreased interest in sex, mood swings, headaches, or lack of sleep. Treatment for this condition may include hormone replacement therapy. Take actions to keep yourself healthy, including exercising regularly, eating a healthy diet, watching your weight, and checking your blood pressure and blood sugar levels. Get screened for cancer and depression. Make sure that you are up to date with all your vaccines. This information is not intended to replace advice given to you by your health care provider. Make sure you discuss any questions you have with your health care provider. Document Revised: 09/12/2020 Document Reviewed: 09/12/2020 Elsevier Patient Education  [place].

## 2021-12-15 NOTE — Progress Notes (Unsigned)
Subjective:    Patient ID: Denise Gaines, female    DOB: [birth year], 46 y.o.   MRN: [id number]  CC: Denise Gaines is a 46 y.o. female who presents today for physical exam.    HPI: Feels well. No new complaints She has felt more irritable. She has started estroven and no long having hot sweats at night. Sheets are not drenched.  She endorses feeling 'brain fog' x 6 months. No HA, vision changes, aphasia, problems  with ,attention.  She hasnt    She has lost 62 lbs over the past couple of years.     Colorectal Cancer Screening: UTD ,03/02/21 Breast Cancer Screening: Mammogram UTD Cervical Cancer Screening: UTD, 10/20/2020 negative malignancy and HPV. She IUD. No vaginal bleeding.    Lung Cancer Screening: Doesn't have 20 year pack year history and age > 71 years yo 46 years        Tetanus - UTD  Labs: Screening labs done prior Exercise: Gets regular exercise at gym 3 times per week.    Alcohol use:  none Smoking/tobacco use: Nonsmoker.     HISTORY:  Past Medical History:  Diagnosis Date   Depression    Obesity     Past Surgical History:  Procedure Laterality Date   BREAST BIOPSY Right 2017   NEG   COLONOSCOPY WITH PROPOFOL N/A 09/23/2019   Procedure: COLONOSCOPY WITH PROPOFOL;  Surgeon: Virgel Manifold, MD;  Location: [facility] ENDOSCOPY;  Service: Endoscopy;  Laterality: N/A;   COLONOSCOPY WITH PROPOFOL N/A 03/02/2021   Procedure: COLONOSCOPY WITH PROPOFOL;  Surgeon: Virgel Manifold, MD;  Location: [facility] ENDOSCOPY;  Service: Endoscopy;  Laterality: N/A;   NASAL SINUS SURGERY  [phone]   Family History  Problem Relation Age of Onset   Mental illness Mother    Breast cancer Mother 54   Thyroid cancer Neg Hx       ALLERGIES: Patient has no known allergies.  Current Outpatient Medications on File Prior to Visit  Medication Sig Dispense Refill   buPROPion (WELLBUTRIN) 75 MG tablet Take 2 tablets (150 mg total) by mouth 2 (two) times daily. 360 tablet 2    levonorgestrel (MIRENA) 20 MCG/24HR IUD 1 each by Intrauterine route once.     methylPREDNISolone (MEDROL DOSEPAK) 4 MG TBPK tablet 6 day taper; take as directed on package instructions 21 tablet 0   polyethylene glycol (MIRALAX / GLYCOLAX) 17 g packet Take 17 g by mouth daily as needed.     sertraline (ZOLOFT) 100 MG tablet TAKE 2 TABLETS BY MOUTH EVERY DAY 180 tablet 1   traZODone (DESYREL) 100 MG tablet Take 1 tablet (100 mg total) by mouth at bedtime. 90 tablet 2   No current facility-administered medications on file prior to visit.    Social History   Tobacco Use   Smoking status: Never   Smokeless tobacco: Never  Vaping Use   Vaping Use: Never used  Substance Use Topics   Alcohol use: No   Drug use: No    Review of Systems    Objective:    BP 118/80 (BP Location: Left Arm, Patient Position: Sitting, Cuff Size: Normal)   Pulse 71   Temp 97.8 F (36.6 C) (Oral)   Ht '5\' 6"'$  (1.676 m)   Wt 217 lb 12.8 oz (98.8 kg)   LMP  (LMP Unknown)   SpO2 96%   BMI 35.15 kg/m   BP Readings from Last 3 Encounters:  12/15/21 118/80  11/30/21 118/81  03/02/21 123/73  Wt Readings from Last 3 Encounters:  12/15/21 217 lb 12.8 oz (98.8 kg)  03/02/21 247 lb 8.2 oz (112.3 kg)  02/08/21 241 lb (109.3 kg)    Physical Exam     Assessment & Plan:   Problem List Items Addressed This Visit   None    I am having Denise Gaines maintain her levonorgestrel, polyethylene glycol, traZODone, buPROPion, sertraline, and methylPREDNISolone.   No orders of the defined types were placed in this encounter.   Return precautions given.   Risks, benefits, and alternatives of the medications and treatment plan prescribed today were discussed, and patient expressed understanding.   Education regarding symptom management and diagnosis given to patient on AVS.   Continue to follow with Burnard Hawthorne, FNP for routine health maintenance.   Denise Gaines and I agreed with plan.    Mable Paris, FNP

## 2021-12-16 LAB — B12 AND FOLATE PANEL
Folate: 7.3 ng/mL
Vitamin B-12: 481 pg/mL (ref 200–1100)

## 2021-12-16 LAB — PROLACTIN: Prolactin: 12.5 ng/mL

## 2021-12-16 LAB — FOLLICLE STIMULATING HORMONE: FSH: 36.2 m[IU]/mL

## 2021-12-16 LAB — HCG, SERUM, QUALITATIVE: Preg, Serum: NEGATIVE

## 2021-12-18 ENCOUNTER — Encounter: Payer: Self-pay | Admitting: Family

## 2021-12-18 NOTE — Assessment & Plan Note (Signed)
Chronic, stable.  Continue Ozempic 2 mg

## 2021-12-18 NOTE — Assessment & Plan Note (Addendum)
Clinical breast exam performed today.  Deferred pelvic exam in the absence of complaints and Pap smear is up-to-date.  Encouraged continued exercise.  Next year we will plan a referral to GYN for discussion of removal of IUD at 5 year mark.

## 2021-12-18 NOTE — Assessment & Plan Note (Signed)
Presentation consistent with vasomotor symptoms of menopause.  We opted to wean off of Zoloft and start Paxil 10 mg.  She will also pay close attention to Wellbutrin as advised Wellbutrin may exacerbate irritability, vasomotor symptoms.  She will let me know how she is doing.

## 2022-01-04 ENCOUNTER — Other Ambulatory Visit: Payer: Self-pay

## 2022-01-05 ENCOUNTER — Other Ambulatory Visit: Payer: Self-pay

## 2022-01-06 ENCOUNTER — Other Ambulatory Visit: Payer: Self-pay | Admitting: Family

## 2022-01-06 DIAGNOSIS — F32A Depression, unspecified: Secondary | ICD-10-CM

## 2022-01-09 ENCOUNTER — Other Ambulatory Visit: Payer: Self-pay

## 2022-01-09 ENCOUNTER — Encounter: Payer: Self-pay | Admitting: Family

## 2022-01-10 ENCOUNTER — Other Ambulatory Visit: Payer: Self-pay | Admitting: Family

## 2022-01-10 NOTE — Progress Notes (Signed)
close

## 2022-01-12 ENCOUNTER — Other Ambulatory Visit: Payer: Self-pay | Admitting: Family

## 2022-01-12 DIAGNOSIS — F32A Depression, unspecified: Secondary | ICD-10-CM

## 2022-01-12 MED ORDER — PAROXETINE HCL 30 MG PO TABS: 30.0000 mg | ORAL_TABLET | Freq: Every day | ORAL | 3 refills | Status: DC

## 2022-01-12 NOTE — Telephone Encounter (Signed)
Okay for Patient to increase Paxil? Last ov:12/15/21

## 2022-01-29 ENCOUNTER — Other Ambulatory Visit: Payer: Self-pay

## 2022-02-01 ENCOUNTER — Other Ambulatory Visit: Payer: Self-pay

## 2022-02-05 ENCOUNTER — Other Ambulatory Visit: Payer: Self-pay

## 2022-02-05 ENCOUNTER — Encounter: Payer: Self-pay | Admitting: Family

## 2022-02-05 ENCOUNTER — Telehealth: Payer: 59 | Admitting: Family

## 2022-02-05 VITALS — Ht 62.0 in | Wt 208.1 lb

## 2022-02-05 DIAGNOSIS — N951 Menopausal and female climacteric states: Secondary | ICD-10-CM | POA: Diagnosis not present

## 2022-02-05 MED ORDER — HYDROXYZINE HCL 10 MG PO TABS
10.0000 mg | ORAL_TABLET | Freq: Two times a day (BID) | ORAL | 0 refills | Status: DC | PRN
  Filled 2022-02-05: qty 90, 45d supply, fill #0

## 2022-02-05 MED ORDER — HYDROXYZINE HCL 10 MG PO TABS
10.0000 mg | ORAL_TABLET | Freq: Two times a day (BID) | ORAL | 0 refills | Status: DC | PRN
  Filled 2022-02-05: qty 60, 30d supply, fill #0
  Filled 2022-03-05: qty 30, 15d supply, fill #1

## 2022-02-05 MED ORDER — PAROXETINE HCL 40 MG PO TABS
40.0000 mg | ORAL_TABLET | ORAL | 1 refills | Status: DC
  Filled 2022-02-05: qty 30, 30d supply, fill #0
  Filled 2022-03-05: qty 30, 30d supply, fill #1

## 2022-02-05 NOTE — Progress Notes (Signed)
Virtual Visit via Video Note  I connected with@  on 02/05/22 at 12:00 PM EDT by a video enabled telemedicine application and verified that I am speaking with the correct person using two identifiers.  Location patient: home Location provider:work  Persons participating in the virtual visit: patient, provider  I discussed the limitations of evaluation and management by telemedicine and the availability of in person appointments. The patient expressed understanding and agreed to proceed.  Interactive audio and video telecommunications were attempted between this provider and patient, however failed, due to patient having technical difficulties or patient did not have access to video capability.  We continued and completed visit with audio only.    HPI:  She increased paxil to '30mg'$ . She feels well on wellbutrin '300mg'$ . She is interested increasing paxil.  Hotflashes have improved at night.   She has son graduating from [place] this week. She feels very emotional about missing her son.    ROS: See pertinent positives and negatives per HPI.    EXAM:  VITALS per patient if applicable: Ht '5\' 2"'$  (1.575 m)   Wt 208 lb 1.6 oz (94.4 kg)   BMI 38.06 kg/m  BP Readings from Last 3 Encounters:  12/15/21 118/80  11/30/21 118/81  03/02/21 123/73   Wt Readings from Last 3 Encounters:  02/05/22 208 lb 1.6 oz (94.4 kg)  12/15/21 217 lb 12.8 oz (98.8 kg)  03/02/21 247 lb 8.2 oz (112.3 kg)      02/05/2022   12:22 PM 12/15/2021    3:22 PM 10/20/2020    8:33 AM  Depression screen PHQ 2/9  Decreased Interest 1 0 0  Down, Depressed, Hopeless 1 0 0  PHQ - 2 Score 2 0 0  Altered sleeping 0  0  Tired, decreased energy 0  0  Change in appetite 0  0  Feeling bad or failure about yourself  0  0  Trouble concentrating 0  0  Moving slowly or fidgety/restless 0  0  Suicidal thoughts 0  0  PHQ-9 Score 2  0  Difficult doing work/chores Not difficult at all  Not difficult at all     ASSESSMENT  AND PLAN:  Discussed the following assessment and plan:  Problem List Items Addressed This Visit       Other   Vasomotor symptoms due to menopause - Primary    Improved. We agreed for anxiety increase of paxil to '40mg'$  and trial of atarax '10mg'$  BID prn. She will let me know how she is doing,       Relevant Medications   PARoxetine (PAXIL) 40 MG tablet   hydrOXYzine (ATARAX) 10 MG tablet    -we discussed possible serious and likely etiologies, options for evaluation and workup, limitations of telemedicine visit vs in person visit, treatment, treatment risks and precautions. Pt prefers to treat via telemedicine empirically rather then risking or undertaking an in person visit at this moment.  .   I discussed the assessment and treatment plan with the patient. The patient was provided an opportunity to ask questions and all were answered. The patient agreed with the plan and demonstrated an understanding of the instructions.   The patient was advised to call back or seek an in-person evaluation if the symptoms worsen or if the condition fails to improve as anticipated.  I spent 11 minutes with this patient on the phone.   Mable Paris, FNP

## 2022-02-05 NOTE — Assessment & Plan Note (Signed)
Improved. We agreed for anxiety increase of paxil to '40mg'$  and trial of atarax '10mg'$  BID prn. She will let me know how she is doing,

## 2022-02-20 ENCOUNTER — Other Ambulatory Visit: Payer: Self-pay

## 2022-02-20 MED ORDER — LEVOFLOXACIN 500 MG PO TABS
500.0000 mg | ORAL_TABLET | Freq: Every day | ORAL | 0 refills | Status: DC
  Filled 2022-02-20: qty 10, 10d supply, fill #0

## 2022-03-06 ENCOUNTER — Other Ambulatory Visit: Payer: Self-pay

## 2022-03-26 ENCOUNTER — Encounter: Payer: Self-pay | Admitting: Family

## 2022-03-26 ENCOUNTER — Other Ambulatory Visit: Payer: Self-pay

## 2022-03-26 ENCOUNTER — Other Ambulatory Visit: Payer: Self-pay | Admitting: Family

## 2022-03-26 DIAGNOSIS — F32A Depression, unspecified: Secondary | ICD-10-CM

## 2022-03-26 DIAGNOSIS — N951 Menopausal and female climacteric states: Secondary | ICD-10-CM

## 2022-03-26 MED ORDER — PAROXETINE HCL 40 MG PO TABS
40.0000 mg | ORAL_TABLET | ORAL | 3 refills | Status: DC
  Filled 2022-03-26: qty 30, 30d supply, fill #0
  Filled 2022-04-26: qty 30, 30d supply, fill #1
  Filled 2022-05-31: qty 30, 30d supply, fill #2
  Filled 2022-07-05: qty 30, 30d supply, fill #3
  Filled 2022-08-16 – 2022-08-17 (×3): qty 30, 30d supply, fill #4
  Filled 2022-09-17: qty 30, 30d supply, fill #5
  Filled 2022-10-25: qty 30, 30d supply, fill #6
  Filled 2022-11-28: qty 30, 30d supply, fill #7

## 2022-03-26 MED ORDER — BUPROPION HCL ER (XL) 300 MG PO TB24
300.0000 mg | ORAL_TABLET | Freq: Every morning | ORAL | 3 refills | Status: DC
  Filled 2022-03-26: qty 30, 30d supply, fill #0

## 2022-03-26 MED ORDER — HYDROXYZINE HCL 10 MG PO TABS
10.0000 mg | ORAL_TABLET | Freq: Two times a day (BID) | ORAL | 3 refills | Status: DC | PRN
  Filled 2022-03-26: qty 60, 30d supply, fill #0
  Filled 2022-04-26: qty 60, 30d supply, fill #1
  Filled 2022-05-28: qty 60, 30d supply, fill #2
  Filled 2022-07-05: qty 60, 30d supply, fill #3
  Filled 2022-08-16: qty 60, 30d supply, fill #4
  Filled 2022-09-17: qty 60, 30d supply, fill #5
  Filled 2022-10-25: qty 60, 30d supply, fill #6
  Filled 2022-11-28: qty 60, 30d supply, fill #7

## 2022-03-28 ENCOUNTER — Other Ambulatory Visit: Payer: Self-pay

## 2022-03-30 ENCOUNTER — Other Ambulatory Visit: Payer: Self-pay

## 2022-04-04 ENCOUNTER — Encounter: Payer: Self-pay | Admitting: Family

## 2022-04-06 ENCOUNTER — Other Ambulatory Visit: Payer: Self-pay

## 2022-04-06 DIAGNOSIS — F32A Depression, unspecified: Secondary | ICD-10-CM

## 2022-04-06 MED ORDER — BUPROPION HCL ER (XL) 300 MG PO TB24: 300.0000 mg | ORAL_TABLET | Freq: Every morning | ORAL | 3 refills | Status: DC

## 2022-04-06 NOTE — Telephone Encounter (Signed)
LVM to inform pt that RX  for Wellbutrin SENT IN TO PHARMACY

## 2022-04-20 ENCOUNTER — Other Ambulatory Visit: Payer: Self-pay

## 2022-04-26 ENCOUNTER — Encounter: Payer: Self-pay | Admitting: Family

## 2022-04-27 ENCOUNTER — Other Ambulatory Visit: Payer: Self-pay

## 2022-05-11 ENCOUNTER — Encounter: Payer: Self-pay | Admitting: Family

## 2022-05-11 ENCOUNTER — Other Ambulatory Visit: Payer: Self-pay

## 2022-05-11 ENCOUNTER — Telehealth: Payer: 59 | Admitting: Family

## 2022-05-11 VITALS — Ht 62.0 in | Wt 238.1 lb

## 2022-05-11 DIAGNOSIS — K649 Unspecified hemorrhoids: Secondary | ICD-10-CM | POA: Diagnosis not present

## 2022-05-11 DIAGNOSIS — F32A Depression, unspecified: Secondary | ICD-10-CM

## 2022-05-11 DIAGNOSIS — G479 Sleep disorder, unspecified: Secondary | ICD-10-CM | POA: Diagnosis not present

## 2022-05-11 MED ORDER — ZOLPIDEM TARTRATE ER 6.25 MG PO TBCR
6.2500 mg | EXTENDED_RELEASE_TABLET | Freq: Every evening | ORAL | 1 refills | Status: DC | PRN
  Filled 2022-05-11: qty 30, 30d supply, fill #0
  Filled 2022-06-07 – 2022-06-13 (×2): qty 30, 30d supply, fill #1

## 2022-05-11 MED ORDER — LIDOCAINE-HYDROCORTISONE ACE 2-2 % RE KIT
1.0000 | PACK | Freq: Two times a day (BID) | RECTAL | 1 refills | Status: DC
  Filled 2022-05-11 (×2): qty 1, 7d supply, fill #0

## 2022-05-11 NOTE — Progress Notes (Deleted)
   Assessment & Plan:  There are no diagnoses linked to this encounter.   Return precautions given.   Risks, benefits, and alternatives of the medications and treatment plan prescribed today were discussed, and patient expressed understanding.   Education regarding symptom management and diagnosis given to patient on AVS either electronically or printed.  No follow-ups on file.  Mable Paris, FNP  Subjective:    Patient ID: Denise Gaines, female    DOB: 1975/08/01, 47 y.o.   MRN: [id number]  CC: Denise Gaines is a 47 y.o. female who presents today for follow up.   HPI: HPI Follow-up insomnia.  She has been taking Atarax 10 mg, trazodone 100 mg.  Anxiety and depression-compliant with Paxil 40 mg, Wellbutrin '3000mg'$  qam   Allergies: Patient has no known allergies. Current Outpatient Medications on File Prior to Visit  Medication Sig Dispense Refill   buPROPion (WELLBUTRIN XL) 300 MG 24 hr tablet Take 1 tablet (300 mg total) by mouth every morning. 90 tablet 3   hydrOXYzine (ATARAX) 10 MG tablet Take 1 tablet (10 mg total) by mouth 2 (two) times daily as needed for anxiety. 180 tablet 3   levofloxacin (LEVAQUIN) 500 MG tablet Take 1 tablet (500 mg total) by mouth daily. 10 tablet 0   levonorgestrel (MIRENA) 20 MCG/24HR IUD 1 each by Intrauterine route once.     PARoxetine (PAXIL) 40 MG tablet Take 1 tablet (40 mg total) by mouth every morning. 90 tablet 3   polyethylene glycol (MIRALAX / GLYCOLAX) 17 g packet Take 17 g by mouth daily as needed.     Semaglutide, 2 MG/DOSE, 8 MG/3ML SOPN Inject 2 mg as directed once a week. 9 mL 3   traZODone (DESYREL) 100 MG tablet TAKE 1 TABLET BY MOUTH EVERYDAY AT BEDTIME 90 tablet 2   No current facility-administered medications on file prior to visit.    Review of Systems    Objective:    There were no vitals taken for this visit. BP Readings from Last 3 Encounters:  12/15/21 118/80  11/30/21 118/81  03/02/21 123/73   Wt Readings from  Last 3 Encounters:  02/05/22 208 lb 1.6 oz (94.4 kg)  12/15/21 217 lb 12.8 oz (98.8 kg)  03/02/21 247 lb 8.2 oz (112.3 kg)    Physical Exam

## 2022-05-11 NOTE — Progress Notes (Unsigned)
Virtual Visit via Video Note  I connected with Denise Gaines on 05/15/22 at  4:00 PM EST by a video enabled telemedicine application and verified that I am speaking with the correct person using two identifiers. Location patient: home Location provider: work  Persons participating in the virtual visit: patient, provider  I discussed the limitations of evaluation and management by telemedicine and the availability of in person appointments. The patient expressed understanding and agreed to proceed.  HPI: Follow-up insomnia.  She has been taking Atarax 10 mg, trazodone 100 mg which had worked well initially but stopped working.    She has been on ambien 12.'5mg'$  CR for years in the past which was helpful for her.  She also tried seroquel , zoloft in the past.   Anxiety and depression-compliant with Paxil 40 mg, Wellbutrin '300mg'$  qam which is effective for her.   She has started to see a counselor  She stays on miralax to regulate bowls and forgot to take miralax around the holidays. She has resumed miralax.  She describes rectal 'irritation' and BRB from rectum. Small amount ' a drop or two' BRB. She feels hemorrhoids may have 'thrombosed' and she has used preparationH suppository.    Colonoscopy 03/02/21 internal hemorrhoids, polpy .    ROS: See pertinent positives and negatives per HPI.  EXAM:  VITALS per patient if applicable: Ht '5\' 2"'$  (1.575 m)   Wt 238 lb 1.6 oz (108 kg)   LMP  (LMP Unknown)   BMI 43.55 kg/m  BP Readings from Last 3 Encounters:  12/15/21 118/80  11/30/21 118/81  03/02/21 123/73   Wt Readings from Last 3 Encounters:  05/11/22 238 lb 1.6 oz (108 kg)  02/05/22 208 lb 1.6 oz (94.4 kg)  12/15/21 217 lb 12.8 oz (98.8 kg)    GENERAL: alert, oriented, appears well and in no acute distress  HEENT: atraumatic, conjunttiva clear, no obvious abnormalities on inspection of external nose and ears  NECK: normal movements of the head and neck  LUNGS: on  inspection no signs of respiratory distress, breathing rate appears normal, no obvious gross SOB, gasping or wheezing  CV: no obvious cyanosis  MS: moves all visible extremities without noticeable abnormality  PSYCH/NEURO: pleasant and cooperative, no obvious depression or anxiety, speech and thought processing grossly intact  ASSESSMENT AND PLAN: Sleep disturbance Assessment & Plan: Uncontrolled. Previously on Ambien 12.'5mg'$  and done well on medication.  Retrial of Ambien CR 6.25.  Advised to hold Atarax while she resumes Ambien as she likely will not require.  Advised to wean from trazodone.  Advised no alcohol use with ambien.   Orders: -     Zolpidem Tartrate ER; Take 1 tablet (6.25 mg total) by mouth at bedtime as needed for sleep.  Dispense: 30 tablet; Refill: 1  Hemorrhoids, unspecified hemorrhoid type Assessment & Plan: New, uncontrolled. Presentation consistent with hemorrhoids.  Trial of hydrocortisone disown 25 mg suppository.  If no resolution of rectal bleeding, will arrange GI consult   Depression, unspecified depression type Assessment & Plan: Chronic, stable. Continue  Paxil 40 mg, Wellbutrin '300mg'$  qam      -we discussed possible serious and likely etiologies, options for evaluation and workup, limitations of telemedicine visit vs in person visit, treatment, treatment risks and precautions. Pt prefers to treat via telemedicine empirically rather then risking or undertaking an in person visit at this moment.    I discussed the assessment and treatment plan with the patient. The patient was provided an opportunity to  ask questions and all were answered. The patient agreed with the plan and demonstrated an understanding of the instructions.   The patient was advised to call back or seek an in-person evaluation if the symptoms worsen or if the condition fails to improve as anticipated.  Advised if desired AVS can be mailed or viewed via [place] if [facility] user.    Mable Paris, FNP

## 2022-05-11 NOTE — Patient Instructions (Signed)
Trial of Ana Lex for 7 days for hemorrhoids. Please wean from trazodone 100 mg to trazodone 50 mg for the next 7 days and then discontinue completely.  Please start Ambien 6.25 mg.  No alcohol with Ambien.  Please let me know how you are doing

## 2022-05-12 ENCOUNTER — Other Ambulatory Visit: Payer: Self-pay | Admitting: Family

## 2022-05-12 DIAGNOSIS — K649 Unspecified hemorrhoids: Secondary | ICD-10-CM

## 2022-05-12 MED ORDER — HYDROCORTISONE ACETATE 25 MG RE SUPP
25.0000 mg | Freq: Two times a day (BID) | RECTAL | 0 refills | Status: DC
  Filled 2022-05-12: qty 12, 6d supply, fill #0

## 2022-05-14 ENCOUNTER — Other Ambulatory Visit: Payer: Self-pay

## 2022-05-15 NOTE — Assessment & Plan Note (Addendum)
New, uncontrolled. Presentation consistent with hemorrhoids.  Trial of hydrocortisone disown 25 mg suppository.  If no resolution of rectal bleeding, will arrange GI consult

## 2022-05-15 NOTE — Assessment & Plan Note (Addendum)
Uncontrolled. Previously on Ambien 12.'5mg'$  and done well on medication.  Retrial of Ambien CR 6.25.  Advised to hold Atarax while she resumes Ambien as she likely will not require.  Advised to wean from trazodone.  Advised no alcohol use with ambien.

## 2022-05-15 NOTE — Assessment & Plan Note (Signed)
Chronic, stable. Continue  Paxil 40 mg, Wellbutrin '300mg'$  qam

## 2022-05-28 ENCOUNTER — Other Ambulatory Visit: Payer: Self-pay

## 2022-05-29 ENCOUNTER — Other Ambulatory Visit (HOSPITAL_COMMUNITY): Payer: Self-pay

## 2022-05-31 ENCOUNTER — Other Ambulatory Visit: Payer: Self-pay | Admitting: Family

## 2022-05-31 ENCOUNTER — Encounter: Payer: Self-pay | Admitting: Family

## 2022-05-31 ENCOUNTER — Other Ambulatory Visit: Payer: Self-pay

## 2022-06-01 ENCOUNTER — Other Ambulatory Visit: Payer: Self-pay | Admitting: Family

## 2022-06-01 ENCOUNTER — Other Ambulatory Visit: Payer: Self-pay

## 2022-06-01 MED ORDER — WEGOVY 2.4 MG/0.75ML ~~LOC~~ SOAJ
2.4000 mg | SUBCUTANEOUS | 1 refills | Status: DC
  Filled 2022-06-01 – 2022-06-07 (×2): qty 3, 28d supply, fill #0
  Filled 2022-07-05: qty 3, 28d supply, fill #1

## 2022-06-01 NOTE — Telephone Encounter (Signed)
S/w pt - advised of process for PA's Pt understands

## 2022-06-06 ENCOUNTER — Other Ambulatory Visit (HOSPITAL_COMMUNITY): Payer: Self-pay

## 2022-06-06 NOTE — Telephone Encounter (Signed)
Patient Advocate Encounter   Received notification from [place] that prior authorization for [facility] is required.   PA submitted on 06/06/2022 Key [id number] Status is pending

## 2022-06-07 ENCOUNTER — Other Ambulatory Visit: Payer: Self-pay

## 2022-06-07 NOTE — Telephone Encounter (Signed)
Pharmacy Patient Advocate Encounter  Prior Authorization for Denise Gaines has been approved.    Effective dates: 06/06/2022 through 01/05/2023

## 2022-06-13 ENCOUNTER — Other Ambulatory Visit: Payer: Self-pay

## 2022-06-14 ENCOUNTER — Other Ambulatory Visit: Payer: Self-pay

## 2022-07-05 ENCOUNTER — Other Ambulatory Visit: Payer: Self-pay

## 2022-07-06 ENCOUNTER — Other Ambulatory Visit: Payer: Self-pay

## 2022-07-19 ENCOUNTER — Encounter: Payer: Self-pay | Admitting: Family

## 2022-07-20 ENCOUNTER — Other Ambulatory Visit: Payer: Self-pay | Admitting: Family

## 2022-07-20 ENCOUNTER — Other Ambulatory Visit: Payer: Self-pay

## 2022-07-20 DIAGNOSIS — G479 Sleep disorder, unspecified: Secondary | ICD-10-CM

## 2022-07-20 MED ORDER — ZOLPIDEM TARTRATE ER 6.25 MG PO TBCR
6.2500 mg | EXTENDED_RELEASE_TABLET | Freq: Every evening | ORAL | 2 refills | Status: DC | PRN
  Filled 2022-07-20: qty 30, 30d supply, fill #0
  Filled 2022-08-16 – 2022-08-17 (×2): qty 30, 30d supply, fill #1
  Filled 2022-09-17: qty 30, 30d supply, fill #2

## 2022-08-16 ENCOUNTER — Other Ambulatory Visit: Payer: Self-pay | Admitting: Family

## 2022-08-16 ENCOUNTER — Other Ambulatory Visit: Payer: Self-pay

## 2022-08-16 MED ORDER — WEGOVY 2.4 MG/0.75ML ~~LOC~~ SOAJ
2.4000 mg | SUBCUTANEOUS | 1 refills | Status: DC
  Filled 2022-08-16: qty 3, 28d supply, fill #0
  Filled 2022-09-17: qty 3, 28d supply, fill #1

## 2022-08-17 ENCOUNTER — Other Ambulatory Visit: Payer: Self-pay

## 2022-08-22 ENCOUNTER — Other Ambulatory Visit: Payer: Self-pay

## 2022-08-22 MED ORDER — POLYETHYLENE GLYCOL 3350 17 G PO PACK
17.0000 g | PACK | Freq: Every day | ORAL | 1 refills | Status: AC | PRN
  Filled 2022-08-22: qty 14, 14d supply, fill #0

## 2022-08-22 NOTE — Telephone Encounter (Signed)
Okay to refill?   Last ordered: 1 year ago (02/08/2021) by Historical Provider, MD  PT was last seen in January 2024

## 2022-09-18 ENCOUNTER — Other Ambulatory Visit: Payer: Self-pay

## 2022-09-28 ENCOUNTER — Telehealth: Payer: 59 | Admitting: Nurse Practitioner

## 2022-09-28 DIAGNOSIS — J0140 Acute pansinusitis, unspecified: Secondary | ICD-10-CM

## 2022-09-28 MED ORDER — FLUTICASONE PROPIONATE 50 MCG/ACT NA SUSP: 2.0000 | Freq: Every day | NASAL | 6 refills | Status: AC

## 2022-09-28 MED ORDER — AMOXICILLIN-POT CLAVULANATE 875-125 MG PO TABS: 1.0000 | ORAL_TABLET | Freq: Two times a day (BID) | ORAL | 0 refills | Status: DC

## 2022-09-28 NOTE — Progress Notes (Signed)
E-Visit for Sinus Problems  We are sorry that you are not feeling well.  Here is how we plan to help!  Based on what you have shared with me it looks like you have sinusitis.  Sinusitis is inflammation and infection in the sinus cavities of the head.  Based on your presentation I believe you most likely have Acute Bacterial Sinusitis.  This is an infection caused by bacteria and is treated with antibiotics. I have prescribed Augmentin 875mg /125mg  one tablet twice daily with food, for 7 days. We also recommend you try Flonase for added sinus relief.   Meds ordered this encounter  Medications   amoxicillin-clavulanate (AUGMENTIN) 875-125 MG tablet    Sig: Take 1 tablet by mouth 2 (two) times daily.    Dispense:  20 tablet    Refill:  0   fluticasone (FLONASE) 50 MCG/ACT nasal spray    Sig: Place 2 sprays into both nostrils daily.    Dispense:  16 g    Refill:  6   You may use an oral decongestant such as Mucinex D or if you have glaucoma or high blood pressure use plain Mucinex. Saline nasal spray help and can safely be used as often as needed for congestion.  If you develop worsening sinus pain, fever or notice severe headache and vision changes, or if symptoms are not better after completion of antibiotic, please schedule an appointment with a health care provider.    Sinus infections are not as easily transmitted as other respiratory infection, however we still recommend that you avoid close contact with loved ones, especially the very young and elderly.  Remember to wash your hands thoroughly throughout the day as this is the number one way to prevent the spread of infection!  Home Care: Only take medications as instructed by your medical team. Complete the entire course of an antibiotic. Do not take these medications with alcohol. A steam or ultrasonic humidifier can help congestion.  You can place a towel over your head and breathe in the steam from hot water coming from a faucet. Avoid  close contacts especially the very young and the elderly. Cover your mouth when you cough or sneeze. Always remember to wash your hands.  Get Help Right Away If: You develop worsening fever or sinus pain. You develop a severe head ache or visual changes. Your symptoms persist after you have completed your treatment plan.  Make sure you Understand these instructions. Will watch your condition. Will get help right away if you are not doing well or get worse.  Thank you for choosing an e-visit.  Your e-visit answers were reviewed by a board certified advanced clinical practitioner to complete your personal care plan. Depending upon the condition, your plan could have included both over the counter or prescription medications.  Please review your pharmacy choice. Make sure the pharmacy is open so you can pick up prescription now. If there is a problem, you may contact your provider through [employer] and have the prescription routed to another pharmacy.  Your safety is important to [place]. If you have drug allergies check your prescription carefully.   For the next 24 hours you can use MyChart to ask questions about today's visit, request a non-urgent call back, or ask for a work or school excuse. You will get an email in the next two days asking about your experience. I hope that your e-visit has been valuable and will speed your recovery.  I spent approximately 5 minutes  reviewing the patient's history, current symptoms and coordinating their care today.

## 2022-10-03 ENCOUNTER — Encounter: Payer: Self-pay | Admitting: Family

## 2022-10-03 ENCOUNTER — Telehealth: Payer: Self-pay | Admitting: Obstetrics and Gynecology

## 2022-10-03 ENCOUNTER — Other Ambulatory Visit: Payer: Self-pay | Admitting: Family

## 2022-10-03 DIAGNOSIS — Z30431 Encounter for routine checking of intrauterine contraceptive device: Secondary | ICD-10-CM

## 2022-10-03 NOTE — Telephone Encounter (Signed)
Called to speak with patient on behalf of referral. Patient states that her IUD is due to be removed and replaced. Spoke with a provider and was Informed that her Mirena IUD is "good" for 8 years. Patient asked to speak with the provider. Patient spoke with ABC &  informed pt that her Mirena IUD that was placed on 09/10/2017 with Melody Shambley according to consent document uploaded to patients chart on 11/11/2017 is in fact not set to expire until 2027 since it was placed in 2019. Patient voiced understanding.

## 2022-10-16 NOTE — Telephone Encounter (Signed)
Pt called in asking to speak to [facility] CMA. She will be waiting for her call

## 2022-10-25 ENCOUNTER — Other Ambulatory Visit: Payer: Self-pay

## 2022-10-25 ENCOUNTER — Other Ambulatory Visit: Payer: Self-pay | Admitting: Family

## 2022-10-25 DIAGNOSIS — G479 Sleep disorder, unspecified: Secondary | ICD-10-CM

## 2022-10-26 ENCOUNTER — Other Ambulatory Visit: Payer: Self-pay

## 2022-10-26 MED ORDER — WEGOVY 2.4 MG/0.75ML ~~LOC~~ SOAJ
2.4000 mg | SUBCUTANEOUS | 1 refills | Status: DC
  Filled 2022-10-26: qty 3, 28d supply, fill #0
  Filled 2022-11-28: qty 3, 28d supply, fill #1

## 2022-10-26 MED ORDER — ZOLPIDEM TARTRATE ER 6.25 MG PO TBCR
6.2500 mg | EXTENDED_RELEASE_TABLET | Freq: Every evening | ORAL | 0 refills | Status: DC | PRN
  Filled 2022-10-26: qty 30, 30d supply, fill #0

## 2022-10-30 ENCOUNTER — Telehealth: Payer: Self-pay

## 2022-10-30 NOTE — Telephone Encounter (Signed)
See telephone note.

## 2022-10-30 NOTE — Telephone Encounter (Signed)
Per Rennie Plowman pt needs appointment for Ambien refill.  She has sent in a 1 month supply but pt will need to be seen before anymore refills will be sent in.  Called and left a voicemail for pt to call office and schedule an appointment.

## 2022-10-31 NOTE — Telephone Encounter (Signed)
Spoke to pt as she has already scheduled appt for 12/19/22 for in office visit

## 2022-11-28 ENCOUNTER — Other Ambulatory Visit: Payer: Self-pay

## 2022-11-29 ENCOUNTER — Other Ambulatory Visit: Payer: Self-pay

## 2022-12-05 ENCOUNTER — Encounter (INDEPENDENT_AMBULATORY_CARE_PROVIDER_SITE_OTHER): Payer: Self-pay

## 2022-12-07 ENCOUNTER — Telehealth: Payer: 59 | Admitting: Family Medicine

## 2022-12-07 DIAGNOSIS — J069 Acute upper respiratory infection, unspecified: Secondary | ICD-10-CM

## 2022-12-07 DIAGNOSIS — J0140 Acute pansinusitis, unspecified: Secondary | ICD-10-CM

## 2022-12-08 MED ORDER — AMOXICILLIN-POT CLAVULANATE 875-125 MG PO TABS
1.0000 | ORAL_TABLET | Freq: Two times a day (BID) | ORAL | 0 refills | Status: DC
Start: 2022-12-08 — End: 2022-12-19

## 2022-12-08 NOTE — Progress Notes (Signed)

## 2022-12-19 ENCOUNTER — Telehealth: Payer: Self-pay | Admitting: Family

## 2022-12-19 ENCOUNTER — Ambulatory Visit (INDEPENDENT_AMBULATORY_CARE_PROVIDER_SITE_OTHER): Payer: 59 | Admitting: Family

## 2022-12-19 ENCOUNTER — Encounter: Payer: Self-pay | Admitting: Family

## 2022-12-19 VITALS — BP 118/76 | HR 76 | Temp 97.9°F | Ht 66.0 in | Wt 220.4 lb

## 2022-12-19 DIAGNOSIS — K649 Unspecified hemorrhoids: Secondary | ICD-10-CM

## 2022-12-19 DIAGNOSIS — G479 Sleep disorder, unspecified: Secondary | ICD-10-CM | POA: Diagnosis not present

## 2022-12-19 DIAGNOSIS — Z0000 Encounter for general adult medical examination without abnormal findings: Secondary | ICD-10-CM

## 2022-12-19 DIAGNOSIS — F32A Depression, unspecified: Secondary | ICD-10-CM

## 2022-12-19 DIAGNOSIS — N951 Menopausal and female climacteric states: Secondary | ICD-10-CM

## 2022-12-19 MED ORDER — ZOLPIDEM TARTRATE ER 12.5 MG PO TBCR: 12.5000 mg | EXTENDED_RELEASE_TABLET | Freq: Every evening | ORAL | 2 refills | Status: DC | PRN

## 2022-12-19 MED ORDER — PAROXETINE HCL 40 MG PO TABS
40.0000 mg | ORAL_TABLET | ORAL | 2 refills | Status: DC
  Filled 2023-01-25: qty 90, 90d supply, fill #0
  Filled 2023-04-23: qty 30, 30d supply, fill #1
  Filled 2023-05-24: qty 30, 30d supply, fill #2
  Filled 2023-06-24: qty 30, 30d supply, fill #3

## 2022-12-19 MED ORDER — HYDROXYZINE HCL 10 MG PO TABS
10.0000 mg | ORAL_TABLET | Freq: Two times a day (BID) | ORAL | 2 refills | Status: DC | PRN
  Filled 2023-01-21: qty 60, 30d supply, fill #0
  Filled 2023-05-24: qty 180, 90d supply, fill #0
  Filled 2023-08-22: qty 180, 90d supply, fill #1
  Filled 2023-11-20: qty 180, 90d supply, fill #2

## 2022-12-19 MED ORDER — HYDROCORTISONE ACETATE 25 MG RE SUPP
25.0000 mg | Freq: Two times a day (BID) | RECTAL | 0 refills | Status: DC
Start: 2022-12-19 — End: 2023-03-07

## 2022-12-19 NOTE — Progress Notes (Signed)
Assessment & Plan:  Annual physical exam Assessment & Plan: Clinical breast exam performed today.  Encouraged continued exercise.  Orders: -     VITAMIN D 25 Hydroxy (Vit-D Deficiency, Fractures); Future -     CBC with Differential/Platelet; Future -     Comprehensive metabolic panel; Future -     Lipid panel; Future -     TSH; Future -     Hemoglobin A1c; Future -     hydrOXYzine HCl; Take 1 tablet (10 mg total) by mouth 2 (two) times daily as needed for anxiety.  Dispense: 180 tablet; Refill: 3 -     Hydrocortisone Acetate; Place 1 suppository (25 mg total) rectally 2 (two) times daily.  Dispense: 12 suppository; Refill: 0  Vasomotor symptoms due to menopause -     hydrOXYzine HCl; Take 1 tablet (10 mg total) by mouth 2 (two) times daily as needed for anxiety.  Dispense: 180 tablet; Refill: 3 -     PARoxetine HCl; Take 1 tablet (40 mg total) by mouth every morning.  Dispense: 90 tablet; Refill: 3  Hemorrhoids, unspecified hemorrhoid type -     Hydrocortisone Acetate; Place 1 suppository (25 mg total) rectally 2 (two) times daily.  Dispense: 12 suppository; Refill: 0  Sleep disturbance -     Zolpidem Tartrate ER; Take 1 tablet (12.5 mg total) by mouth at bedtime as needed for sleep.  Dispense: 30 tablet; Refill: 2  Depression, unspecified depression type Assessment & Plan: Chronic, stable.  Continue Paxil 40 mg daily, Atarax 10 mg twice daily as needed.  We agreed to increase Ambien CR to 12.5mg  .  Counseled on risk of excessive sedation.  Patient does not drink alcohol.  She will let me know how she is doing.      Return precautions given.   Risks, benefits, and alternatives of the medications and treatment plan prescribed today were discussed, and patient expressed understanding.   Education regarding symptom management and diagnosis given to patient on AVS either electronically or printed.  No follow-ups on file.  Denise Plowman, FNP  Subjective:    Patient ID:  Denise Gaines, female    DOB: 07-11-1975, 47 y.o.   MRN: [id number]  CC: Denise Gaines is a 47 y.o. female who presents today for physical exam.    HPI: She has lost 60lbs in the 18 months She remains compliant with MWNUUV.   She has been pleased with Paxil, and as needed use of Atarax 10 mg She feels Ambien dose has been less effective.  She has taken 2 of the 6.25 mg tablet and able to sleep through the night.  She does not feel excessively sedated on this dose   Colorectal Cancer Screening: UTD , 03/02/2021, repeat in 5 years due to sessile polyp.  Breast Cancer Screening: Mammogram due Cervical Cancer Screening: UTD, 10/20/2020 negative malignancy, HPV Bone Health screening/DEXA for 65+: No increased fracture risk. Defer screening at this time.  Lung Cancer Screening: Doesn't have 20 year pack year history and age > 77 years yo 30 years        Tetanus - UTD         Exercise: Gets regular exercise , going to the gym.   Alcohol use:  none Smoking/tobacco use: Nonsmoker.    Health Maintenance  Topic Date Due   COVID-19 Vaccine (3 - 2023-24 season) 01/05/2022   Flu Shot  08/05/2023*   Pap Smear  10/21/2023   Colon Cancer Screening  03/02/2026  DTaP/Tdap/Td vaccine (2 - Td or Tdap) 12/09/2026   Hepatitis C Screening  Completed   HPV Vaccine  Aged Out   HIV Screening  Discontinued  *Topic was postponed. The date shown is not the original due date.    ALLERGIES: Patient has no known allergies.  Current Outpatient Medications on File Prior to Visit  Medication Sig Dispense Refill   fluticasone (FLONASE) 50 MCG/ACT nasal spray Place 2 sprays into both nostrils daily. 16 g 6   levonorgestrel (MIRENA) 20 MCG/24HR IUD 1 each by Intrauterine route once.     polyethylene glycol (MIRALAX / GLYCOLAX) 17 g packet Take 17 g by mouth daily as needed. 14 each 1   Semaglutide-Weight Management (WEGOVY) 2.4 MG/0.75ML SOAJ Inject 2.4 mg into the skin once a week. 3 mL 1   No current  facility-administered medications on file prior to visit.    Review of Systems  Constitutional:  Negative for chills and fever.  Respiratory:  Negative for cough.   Cardiovascular:  Negative for chest pain and palpitations.  Gastrointestinal:  Negative for nausea and vomiting.      Objective:    BP 118/76   Pulse 76   Temp 97.9 F (36.6 C) (Oral)   Ht 5\' 6"  (1.676 m)   Wt 220 lb 6.4 oz (100 kg)   SpO2 97%   BMI 35.57 kg/m   BP Readings from Last 3 Encounters:  12/19/22 118/76  12/15/21 118/80  11/30/21 118/81   Wt Readings from Last 3 Encounters:  12/19/22 220 lb 6.4 oz (100 kg)  05/11/22 238 lb 1.6 oz (108 kg)  02/05/22 208 lb 1.6 oz (94.4 kg)    Physical Exam Vitals reviewed.  Constitutional:      Appearance: Normal appearance. She is well-developed.  Eyes:     Conjunctiva/sclera: Conjunctivae normal.  Neck:     Thyroid: No thyroid mass or thyromegaly.  Cardiovascular:     Rate and Rhythm: Normal rate and regular rhythm.     Pulses: Normal pulses.     Heart sounds: Normal heart sounds.  Pulmonary:     Effort: Pulmonary effort is normal.     Breath sounds: Normal breath sounds. No wheezing, rhonchi or rales.  Chest:  Breasts:    Breasts are symmetrical.     Right: No inverted nipple, mass, nipple discharge, skin change or tenderness.     Left: No inverted nipple, mass, nipple discharge, skin change or tenderness.  Abdominal:     General: Bowel sounds are normal. There is no distension.     Palpations: Abdomen is soft. Abdomen is not rigid. There is no fluid wave or mass.     Tenderness: There is no abdominal tenderness. There is no guarding or rebound.  Lymphadenopathy:     Head:     Right side of head: No submental, submandibular, tonsillar, preauricular, posterior auricular or occipital adenopathy.     Left side of head: No submental, submandibular, tonsillar, preauricular, posterior auricular or occipital adenopathy.     Cervical: No cervical  adenopathy.     Right cervical: No superficial, deep or posterior cervical adenopathy.    Left cervical: No superficial, deep or posterior cervical adenopathy.  Skin:    General: Skin is warm and dry.  Neurological:     Mental Status: She is alert.  Psychiatric:        Speech: Speech normal.        Behavior: Behavior normal.        Thought  Content: Thought content normal.

## 2022-12-19 NOTE — Telephone Encounter (Signed)
Prescription Request  12/19/2022  LOV: 12/19/2022  What is the name of the medication or equipment? PARoxetine (PAXIL) 40 MG tablet  Have you contacted your pharmacy to request a refill? Yes   Which pharmacy would you like this sent to?   [facility] [address] [place] [address] Phone: [phone] Fax: [phone]   Patient notified that their request is being sent to the clinical staff for review and that they should receive a response within 2 business days.   Please advise at Mobile [phone] (mobile)

## 2022-12-20 NOTE — Telephone Encounter (Signed)
Refilled yesterday. 

## 2022-12-21 NOTE — Patient Instructions (Addendum)
I have sent in Ambien 12.5mg  .  Please monitor for drowsiness, excessive sedation, and most certainly falls.  Very nice seeing you today  health Maintenance for Postmenopausal Women Menopause is a normal process in which your ability to get pregnant comes to an end. This process happens slowly over many months or years, usually between the ages of 43 and 16. Menopause is complete when you have missed your menstrual period for 12 months. It is important to talk with your health care provider about some of the most common conditions that affect women after menopause (postmenopausal women). These include heart disease, cancer, and bone loss (osteoporosis). Adopting a healthy lifestyle and getting preventive care can help to promote your health and wellness. The actions you take can also lower your chances of developing some of these common conditions. What are the signs and symptoms of menopause? During menopause, you may have the following symptoms: Hot flashes. These can be moderate or severe. Night sweats. Decrease in sex drive. Mood swings. Headaches. Tiredness (fatigue). Irritability. Memory problems. Problems falling asleep or staying asleep. Talk with your health care provider about treatment options for your symptoms. Do I need hormone replacement therapy? Hormone replacement therapy is effective in treating symptoms that are caused by menopause, such as hot flashes and night sweats. Hormone replacement carries certain risks, especially as you become older. If you are thinking about using estrogen or estrogen with progestin, discuss the benefits and risks with your health care provider. How can I reduce my risk for heart disease and stroke? The risk of heart disease, heart attack, and stroke increases as you age. One of the causes may be a change in the body's hormones during menopause. This can affect how your body uses dietary fats, triglycerides, and cholesterol. Heart attack and  stroke are medical emergencies. There are many things that you can do to help prevent heart disease and stroke. Watch your blood pressure High blood pressure causes heart disease and increases the risk of stroke. This is more likely to develop in people who have high blood pressure readings or are overweight. Have your blood pressure checked: Every 3-5 years if you are 62-28 years of age. Every year if you are 48 years old or older. Eat a healthy diet  Eat a diet that includes plenty of vegetables, fruits, low-fat dairy products, and lean protein. Do not eat a lot of foods that are high in solid fats, added sugars, or sodium. Get regular exercise Get regular exercise. This is one of the most important things you can do for your health. Most adults should: Try to exercise for at least 150 minutes each week. The exercise should increase your heart rate and make you sweat (moderate-intensity exercise). Try to do strengthening exercises at least twice each week. Do these in addition to the moderate-intensity exercise. Spend less time sitting. Even light physical activity can be beneficial. Other tips Work with your health care provider to achieve or maintain a healthy weight. Do not use any products that contain nicotine or tobacco. These products include cigarettes, chewing tobacco, and vaping devices, such as e-cigarettes. If you need help quitting, ask your health care provider. Know your numbers. Ask your health care provider to check your cholesterol and your blood sugar (glucose). Continue to have your blood tested as directed by your health care provider. Do I need screening for cancer? Depending on your health history and family history, you may need to have cancer screenings at different stages of your  life. This may include screening for: Breast cancer. Cervical cancer. Lung cancer. Colorectal cancer. What is my risk for osteoporosis? After menopause, you may be at increased risk for  osteoporosis. Osteoporosis is a condition in which bone destruction happens more quickly than new bone creation. To help prevent osteoporosis or the bone fractures that can happen because of osteoporosis, you may take the following actions: If you are 81-47 years old, get at least 1,000 mg of calcium and at least 600 international units (IU) of vitamin D per day. If you are older than age 40 but younger than age 56, get at least 1,200 mg of calcium and at least 600 international units (IU) of vitamin D per day. If you are older than age 25, get at least 1,200 mg of calcium and at least 800 international units (IU) of vitamin D per day. Smoking and drinking excessive alcohol increase the risk of osteoporosis. Eat foods that are rich in calcium and vitamin D, and do weight-bearing exercises several times each week as directed by your health care provider. How does menopause affect my mental health? Depression may occur at any age, but it is more common as you become older. Common symptoms of depression include: Feeling depressed. Changes in sleep patterns. Changes in appetite or eating patterns. Feeling an overall lack of motivation or enjoyment of activities that you previously enjoyed. Frequent crying spells. Talk with your health care provider if you think that you are experiencing any of these symptoms. General instructions See your health care provider for regular wellness exams and vaccines. This may include: Scheduling regular health, dental, and eye exams. Getting and maintaining your vaccines. These include: Influenza vaccine. Get this vaccine each year before the flu season begins. Pneumonia vaccine. Shingles vaccine. Tetanus, diphtheria, and pertussis (Tdap) booster vaccine. Your health care provider may also recommend other immunizations. Tell your health care provider if you have ever been abused or do not feel safe at home. Summary Menopause is a normal process in which your  ability to get pregnant comes to an end. This condition causes hot flashes, night sweats, decreased interest in sex, mood swings, headaches, or lack of sleep. Treatment for this condition may include hormone replacement therapy. Take actions to keep yourself healthy, including exercising regularly, eating a healthy diet, watching your weight, and checking your blood pressure and blood sugar levels. Get screened for cancer and depression. Make sure that you are up to date with all your vaccines. This information is not intended to replace advice given to you by your health care provider. Make sure you discuss any questions you have with your health care provider. Document Revised: 09/12/2020 Document Reviewed: 09/12/2020 Elsevier Patient Education  2024 [employer].

## 2022-12-21 NOTE — Assessment & Plan Note (Signed)
Clinical breast exam performed today.  Encouraged continued exercise.

## 2022-12-21 NOTE — Assessment & Plan Note (Signed)
Chronic, stable.  Continue Paxil 40 mg daily, Atarax 10 mg twice daily as needed.  We agreed to increase Ambien CR to 12.5mg  .  Counseled on risk of excessive sedation.  Patient does not drink alcohol.  She will let me know how she is doing.

## 2022-12-31 ENCOUNTER — Other Ambulatory Visit: Payer: Self-pay | Admitting: Family

## 2022-12-31 DIAGNOSIS — Z1231 Encounter for screening mammogram for malignant neoplasm of breast: Secondary | ICD-10-CM

## 2023-01-04 ENCOUNTER — Other Ambulatory Visit (INDEPENDENT_AMBULATORY_CARE_PROVIDER_SITE_OTHER): Payer: 59

## 2023-01-04 DIAGNOSIS — Z0000 Encounter for general adult medical examination without abnormal findings: Secondary | ICD-10-CM | POA: Diagnosis not present

## 2023-01-04 LAB — CBC WITH DIFFERENTIAL/PLATELET
Basophils Absolute: 0.1 10*3/uL (ref 0.0–0.1)
Basophils Relative: 0.7 % (ref 0.0–3.0)
Eosinophils Absolute: 0.1 10*3/uL (ref 0.0–0.7)
Eosinophils Relative: 1.6 % (ref 0.0–5.0)
HCT: 39.3 % (ref 36.0–46.0)
Hemoglobin: 12.4 g/dL (ref 12.0–15.0)
Lymphocytes Relative: 30.9 % (ref 12.0–46.0)
Lymphs Abs: 2.5 10*3/uL (ref 0.7–4.0)
MCHC: 31.5 g/dL (ref 30.0–36.0)
MCV: 79 fl (ref 78.0–100.0)
Monocytes Absolute: 0.5 10*3/uL (ref 0.1–1.0)
Monocytes Relative: 6.6 % (ref 3.0–12.0)
Neutro Abs: 4.8 10*3/uL (ref 1.4–7.7)
Neutrophils Relative %: 60.2 % (ref 43.0–77.0)
Platelets: 342 10*3/uL (ref 150.0–400.0)
RBC: 4.97 Mil/uL (ref 3.87–5.11)
RDW: 18.5 % — ABNORMAL HIGH (ref 11.5–15.5)
WBC: 8 10*3/uL (ref 4.0–10.5)

## 2023-01-04 LAB — LIPID PANEL
Cholesterol: 143 mg/dL (ref 0–200)
HDL: 54.5 mg/dL (ref >39.00)
LDL Cholesterol: 78 mg/dL (ref 0–99)
NonHDL: 88.17
Total CHOL/HDL Ratio: 3
Triglycerides: 52 mg/dL (ref 0.0–149.0)
VLDL: 10.4 mg/dL (ref 0.0–40.0)

## 2023-01-04 LAB — COMPREHENSIVE METABOLIC PANEL
ALT: 10 U/L (ref 0–35)
AST: 16 U/L (ref 0–37)
Albumin: 3.9 g/dL (ref 3.5–5.2)
Alkaline Phosphatase: 103 U/L (ref 39–117)
BUN: 13 mg/dL (ref 6–23)
CO2: 24 mEq/L (ref 19–32)
Calcium: 9.2 mg/dL (ref 8.4–10.5)
Chloride: 107 mEq/L (ref 96–112)
Creatinine, Ser: 0.75 mg/dL (ref 0.40–1.20)
GFR: 94.05 mL/min (ref >60.00)
Glucose, Bld: 80 mg/dL (ref 70–99)
Potassium: 4.2 mEq/L (ref 3.5–5.1)
Sodium: 139 mEq/L (ref 135–145)
Total Bilirubin: 0.5 mg/dL (ref 0.2–1.2)
Total Protein: 6.7 g/dL (ref 6.0–8.3)

## 2023-01-04 LAB — TSH: TSH: 1.57 u[IU]/mL (ref 0.35–5.50)

## 2023-01-04 LAB — HEMOGLOBIN A1C: Hgb A1c MFr Bld: 5.5 % (ref 4.6–6.5)

## 2023-01-04 LAB — VITAMIN D 25 HYDROXY (VIT D DEFICIENCY, FRACTURES): VITD: 48.96 ng/mL (ref 30.00–100.00)

## 2023-01-09 ENCOUNTER — Other Ambulatory Visit: Payer: Self-pay | Admitting: Family

## 2023-01-09 ENCOUNTER — Encounter: Payer: Self-pay | Admitting: Family

## 2023-01-09 ENCOUNTER — Ambulatory Visit
Admission: RE | Admit: 2023-01-09 | Discharge: 2023-01-09 | Disposition: A | Discharge status: Home / Self Care | Payer: 59 | Source: Ambulatory Visit | Attending: Family | Admitting: Family

## 2023-01-09 DIAGNOSIS — R899 Unspecified abnormal finding in specimens from other organs, systems and tissues: Secondary | ICD-10-CM

## 2023-01-09 DIAGNOSIS — Z1231 Encounter for screening mammogram for malignant neoplasm of breast: Secondary | ICD-10-CM | POA: Insufficient documentation

## 2023-01-10 ENCOUNTER — Encounter: Payer: Self-pay | Admitting: Family

## 2023-01-11 ENCOUNTER — Other Ambulatory Visit: Payer: Self-pay

## 2023-01-11 ENCOUNTER — Other Ambulatory Visit: Payer: Self-pay | Admitting: Family

## 2023-01-11 MED ORDER — TIRZEPATIDE 5 MG/0.5ML ~~LOC~~ SOAJ
5.0000 mg | SUBCUTANEOUS | 2 refills | Status: DC
Start: 2023-01-11 — End: 2023-01-18
  Filled 2023-01-11: qty 2, 28d supply, fill #0

## 2023-01-18 ENCOUNTER — Other Ambulatory Visit: Payer: Self-pay | Admitting: Family

## 2023-01-18 ENCOUNTER — Other Ambulatory Visit: Payer: Self-pay

## 2023-01-18 MED ORDER — TIRZEPATIDE-WEIGHT MANAGEMENT 5 MG/0.5ML ~~LOC~~ SOAJ
5.0000 mg | SUBCUTANEOUS | 3 refills | Status: DC
Start: 2023-01-18 — End: 2023-03-12
  Filled 2023-01-21 – 2023-01-25 (×2): qty 2, 28d supply, fill #0
  Filled 2023-02-20: qty 2, 28d supply, fill #1

## 2023-01-21 ENCOUNTER — Other Ambulatory Visit (HOSPITAL_COMMUNITY): Payer: Self-pay

## 2023-01-21 ENCOUNTER — Other Ambulatory Visit: Payer: Self-pay

## 2023-01-21 ENCOUNTER — Telehealth: Payer: Self-pay

## 2023-01-21 ENCOUNTER — Other Ambulatory Visit: Payer: Self-pay | Admitting: Family

## 2023-01-21 DIAGNOSIS — G479 Sleep disorder, unspecified: Secondary | ICD-10-CM

## 2023-01-21 MED ORDER — ZOLPIDEM TARTRATE ER 12.5 MG PO TBCR
12.5000 mg | EXTENDED_RELEASE_TABLET | Freq: Every evening | ORAL | 1 refills | Status: DC | PRN
  Filled 2023-01-21: qty 30, 30d supply, fill #0

## 2023-01-21 MED ORDER — ZOLPIDEM TARTRATE ER 12.5 MG PO TBCR
12.5000 mg | EXTENDED_RELEASE_TABLET | Freq: Every evening | ORAL | 2 refills | Status: DC | PRN
  Filled 2023-01-21: qty 30, 30d supply, fill #0
  Filled 2023-02-20: qty 30, 30d supply, fill #1
  Filled 2023-03-22: qty 30, 30d supply, fill #2

## 2023-01-21 NOTE — Telephone Encounter (Signed)
Pharmacy Patient Advocate Encounter   Received notification from Pt Calls Messages that prior authorization for Zepbound 5mg /0.65ml is required/requested.   Insurance verification completed.   The patient is insured through [facility] .   Per test claim: PA required; PA submitted to [facility] via CoverMyMeds Key/confirmation #/EOC [id number] Status is pending

## 2023-01-24 ENCOUNTER — Other Ambulatory Visit (HOSPITAL_COMMUNITY): Payer: Self-pay

## 2023-01-24 NOTE — Telephone Encounter (Signed)
Pharmacy Patient Advocate Encounter  Received notification from [facility] that Prior Authorization for Zepbound 5mg /0.79ml has been APPROVED from 01/21/23 to 09/18/23   PA #/Case ID/Reference #: [id number]

## 2023-01-25 ENCOUNTER — Other Ambulatory Visit: Payer: Self-pay

## 2023-01-25 NOTE — Telephone Encounter (Signed)
Pt.notified

## 2023-02-01 ENCOUNTER — Other Ambulatory Visit (INDEPENDENT_AMBULATORY_CARE_PROVIDER_SITE_OTHER): Payer: 59

## 2023-02-01 DIAGNOSIS — R899 Unspecified abnormal finding in specimens from other organs, systems and tissues: Secondary | ICD-10-CM | POA: Diagnosis not present

## 2023-02-01 LAB — CBC WITH DIFFERENTIAL/PLATELET
Basophils Absolute: 0.1 10*3/uL (ref 0.0–0.1)
Basophils Relative: 0.7 % (ref 0.0–3.0)
Eosinophils Absolute: 0.1 10*3/uL (ref 0.0–0.7)
Eosinophils Relative: 1.6 % (ref 0.0–5.0)
HCT: 39.8 % (ref 36.0–46.0)
Hemoglobin: 12.7 g/dL (ref 12.0–15.0)
Lymphocytes Relative: 28.8 % (ref 12.0–46.0)
Lymphs Abs: 2.2 10*3/uL (ref 0.7–4.0)
MCHC: 31.9 g/dL (ref 30.0–36.0)
MCV: 80.4 fL (ref 78.0–100.0)
Monocytes Absolute: 0.5 10*3/uL (ref 0.1–1.0)
Monocytes Relative: 6.5 % (ref 3.0–12.0)
Neutro Abs: 4.7 10*3/uL (ref 1.4–7.7)
Neutrophils Relative %: 62.4 % (ref 43.0–77.0)
Platelets: 386 10*3/uL (ref 150.0–400.0)
RBC: 4.95 Mil/uL (ref 3.87–5.11)
RDW: 17.7 % — ABNORMAL HIGH (ref 11.5–15.5)
WBC: 7.5 10*3/uL (ref 4.0–10.5)

## 2023-02-02 LAB — IRON,TIBC AND FERRITIN PANEL
%SAT: 9 % — ABNORMAL LOW (ref 16–45)
Ferritin: 6 ng/mL — ABNORMAL LOW (ref 16–232)
Iron: 36 ug/dL — ABNORMAL LOW (ref 40–190)
TIBC: 397 ug/dL (ref 250–450)

## 2023-02-07 ENCOUNTER — Encounter: Payer: Self-pay | Admitting: Family

## 2023-03-06 ENCOUNTER — Telehealth: Payer: Self-pay

## 2023-03-06 NOTE — Telephone Encounter (Signed)
Patient called in want to schedule appointment for [place]ipro[place]. The patient said she will call us back she is going to arrangement.

## 2023-03-06 NOTE — Progress Notes (Signed)
Denise Amy, PA-C [address]  [address]  [place], [place] [address]  Main: [phone]  Fax: [phone]   Primary Care Physician: Allegra Grana, FNP  Primary Gastroenterologist:  Denise Amy, PA-C   CC: Chronic constipation, anal fissure, hemorrhoids  HPI: Denise Gaines is a 47 y.o. 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  6 weeks ago she had a bad episode of constipation.  She passed a large hard bowel movement.  After that bowel movement, she started having severe rectal pain.  Currently having pain with every bowel movement.  Hurts to sit down.  She has chronic lifelong constipation.  Currently taking MiraLAX 2 capfuls every day.  She tried Linzess and Amitiza in the past which did not help.  Senna causes GI upset.  She had anal fissure in 2021 which healed.  She feels like she has another anal fissure.  No previous rectal surgery.  She has noticed some bright red blood on the tissue after bowel movement.  She has recently used Preparation H, Tucks pads, witch hazel wipes, hydrocortisone cream and suppositories which are not helping.  Constipation has improved on MiraLAX, yet not controlled.  She is last saw Dr. Maximino Greenland here in our office for treatment of anal fissure, constipation and hemorrhoids.    Colonoscopy 09/2019 showed 1 small 6 mm sessile serrated adenoma polyp removed from transverse colon, hemorrhoids, anal fissure, poor prep.  Colonoscopy 02/2021 showed 4 mm tubular adenoma polyp removed from cecum.  Good prep.  Nonthrombosed external hemorrhoids and nonbleeding internal hemorrhoids.  Repeat colonoscopy in 5 years (due 02/2026).  Current Outpatient Medications  Medication Sig Dispense Refill   fluticasone (FLONASE) 50 MCG/ACT nasal spray Place 2 sprays into both nostrils daily. 16 g 6   hydrOXYzine (ATARAX) 10 MG tablet Take 1 tablet (10 mg total) by mouth 2 (two) times daily as needed for anxiety. 180 tablet 2   Lactulose 20  GM/30ML SOLN Take 30 mLs (20 g total) by mouth daily. 900 mL 5   levonorgestrel (MIRENA) 20 MCG/24HR IUD 1 each by Intrauterine route once.     PARoxetine (PAXIL) 40 MG tablet Take 1 tablet (40 mg total) by mouth every morning. 90 tablet 2   polyethylene glycol (MIRALAX / GLYCOLAX) 17 g packet Take 17 g by mouth daily as needed. 14 each 1   tirzepatide (ZEPBOUND) 5 MG/0.5ML Pen Inject 5 mg into the skin once a week. 2 mL 3   zolpidem (AMBIEN CR) 12.5 MG CR tablet Take 1 tablet (12.5 mg total) by mouth at bedtime as needed for sleep. 30 tablet 2   No current facility-administered medications for this visit.    Allergies as of 03/07/2023   (No Known Allergies)    Past Medical History:  Diagnosis Date   Depression    Obesity     Past Surgical History:  Procedure Laterality Date   BREAST BIOPSY Right 2017   NEG   COLONOSCOPY WITH PROPOFOL N/A 09/23/2019   Procedure: COLONOSCOPY WITH PROPOFOL;  Surgeon: Pasty Spillers, MD;  Location: [facility] ENDOSCOPY;  Service: Endoscopy;  Laterality: N/A;   COLONOSCOPY WITH PROPOFOL N/A 03/02/2021   Procedure: COLONOSCOPY WITH PROPOFOL;  Surgeon: Pasty Spillers, MD;  Location: [facility] ENDOSCOPY;  Service: Endoscopy;  Laterality: N/A;   NASAL SINUS SURGERY  [phone]    Review of Systems:    All systems reviewed and negative except where noted in HPI.   Physical Examination:   BP 125/84  Pulse 71   Temp 98.1 F (36.7 C)   Ht 5\' 6"  (1.676 m)   Wt 228 lb (103.4 kg)   BMI 36.80 kg/m   General: Well-nourished, well-developed in no acute distress.  Lungs: Clear to auscultation bilaterally. Non-labored. Heart: Regular rate and rhythm, no murmurs rubs or gallops.  Abdomen: Bowel sounds are normal; Abdomen is Soft; No hepatosplenomegaly, masses or hernias;  No Abdominal Tenderness; No guarding or rebound tenderness. Rectal: Multiple moderate external hemorrhoids which are nonthrombosed.  There is an anal fissure at the left side of the  rectum which is tender.  No gross blood.  Internal exam not performed due to pain. Neuro: Alert and oriented x 3.  Grossly intact.  Psych: Alert and cooperative, normal mood and affect.   Imaging Studies: No results found.  Assessment and Plan:   Denise Gaines is a 47 y.o. y/o female who has history of chronic lifelong constipation.  She had flareup of constipation which caused anal fissure 6 weeks ago.  History of moderate external and internal hemorrhoids.  Anal Fissure Rx Diltiazem / Lidocaine Cream apply to rectum 2-3 times daily, 30 g, 2 refills. Patient education given regarding treatment for anal fissures. Continue warm water sitz bath with Epsom salt. Treat underlying constipation. Discussed referral for Surgery as a last resort if Conservative treatment fails.  2.  Chronic constipation  Rx Lactulose 30 mL once daily.  Continue MiraLAX 2 capfuls daily.  If no improvement, then start Trulance.  Amitiza and Linzess did not work.  Senna caused GI upset.  3.  External and internal hemorrhoids Warm water sitz bath with epsom salt for flare up of external hemorrhoids. Use OTC Preparation H, Tucks Pads, and Witch Hazel wipes as needed.  4.  History of adenomatous colon polyps  5-year repeat colonoscopy will be due 02/2026.  She needs extra prep for colonoscopy procedures.  Denise Amy, PA-C  Follow up in 4 weeks with TG.

## 2023-03-07 ENCOUNTER — Ambulatory Visit: Payer: 59 | Admitting: Physician Assistant

## 2023-03-07 ENCOUNTER — Encounter: Payer: Self-pay | Admitting: Physician Assistant

## 2023-03-07 ENCOUNTER — Telehealth: Payer: Self-pay

## 2023-03-07 ENCOUNTER — Other Ambulatory Visit: Payer: Self-pay

## 2023-03-07 VITALS — BP 125/84 | HR 71 | Temp 98.1°F | Ht 66.0 in | Wt 228.0 lb

## 2023-03-07 DIAGNOSIS — K644 Residual hemorrhoidal skin tags: Secondary | ICD-10-CM | POA: Diagnosis not present

## 2023-03-07 DIAGNOSIS — K649 Unspecified hemorrhoids: Secondary | ICD-10-CM

## 2023-03-07 DIAGNOSIS — K602 Anal fissure, unspecified: Secondary | ICD-10-CM | POA: Diagnosis not present

## 2023-03-07 DIAGNOSIS — Z860101 Personal history of adenomatous and serrated colon polyps: Secondary | ICD-10-CM

## 2023-03-07 DIAGNOSIS — K5904 Chronic idiopathic constipation: Secondary | ICD-10-CM

## 2023-03-07 DIAGNOSIS — K5909 Other constipation: Secondary | ICD-10-CM | POA: Diagnosis not present

## 2023-03-07 DIAGNOSIS — K648 Other hemorrhoids: Secondary | ICD-10-CM | POA: Diagnosis not present

## 2023-03-07 MED ORDER — LACTULOSE 20 GM/30ML PO SOLN
30.0000 mL | Freq: Every day | ORAL | 5 refills | Status: AC
Start: 2023-03-07 — End: 2023-09-03
  Filled 2023-03-07: qty 473, 15d supply, fill #0
  Filled 2023-03-07: qty 900, 30d supply, fill #0
  Filled 2023-03-21: qty 946, 30d supply, fill #1
  Filled 2023-04-21: qty 946, 30d supply, fill #2
  Filled 2023-05-21: qty 946, 30d supply, fill #3
  Filled 2023-06-24: qty 946, 30d supply, fill #4

## 2023-03-07 NOTE — Patient Instructions (Addendum)
For External Hemorrhoids: Continue Warm water sitz bath with epsom salt for flare up of external hemorrhoids. Use OTC Preparation H, Tucks Pads, and Witch Hazel wipes as needed.   [facility] [address] [place], [place], [place] [address] Phone: [phone] Fax: [phone]

## 2023-03-07 NOTE — Telephone Encounter (Signed)
Return in about 4 weeks (around 04/04/2023), or With Inetta Fermo, for Anal Fissure. Patient will call tomorrow to schedule.

## 2023-03-09 ENCOUNTER — Encounter: Payer: Self-pay | Admitting: Family

## 2023-03-12 ENCOUNTER — Other Ambulatory Visit: Payer: Self-pay | Admitting: Family

## 2023-03-12 ENCOUNTER — Other Ambulatory Visit: Payer: Self-pay

## 2023-03-12 MED ORDER — ZEPBOUND 7.5 MG/0.5ML ~~LOC~~ SOAJ
7.5000 mg | SUBCUTANEOUS | 2 refills | Status: DC
  Filled 2023-03-12: qty 2, 28d supply, fill #0
  Filled 2023-04-06: qty 2, 28d supply, fill #1

## 2023-03-21 ENCOUNTER — Other Ambulatory Visit: Payer: Self-pay

## 2023-03-22 ENCOUNTER — Other Ambulatory Visit: Payer: Self-pay

## 2023-04-17 ENCOUNTER — Encounter: Payer: Self-pay | Admitting: Family

## 2023-04-17 ENCOUNTER — Telehealth: Payer: 59 | Admitting: Family

## 2023-04-17 ENCOUNTER — Other Ambulatory Visit: Payer: Self-pay

## 2023-04-17 VITALS — Ht 66.0 in | Wt 214.0 lb

## 2023-04-17 DIAGNOSIS — E66813 Obesity, class 3: Secondary | ICD-10-CM

## 2023-04-17 DIAGNOSIS — G479 Sleep disorder, unspecified: Secondary | ICD-10-CM

## 2023-04-17 DIAGNOSIS — F32A Depression, unspecified: Secondary | ICD-10-CM | POA: Diagnosis not present

## 2023-04-17 MED ORDER — ZEPBOUND 10 MG/0.5ML ~~LOC~~ SOAJ
10.0000 mg | SUBCUTANEOUS | 2 refills | Status: DC
  Filled 2023-04-17 – 2023-04-23 (×2): qty 2, 28d supply, fill #0
  Filled 2023-05-21: qty 2, 28d supply, fill #1

## 2023-04-17 MED ORDER — ZOLPIDEM TARTRATE ER 12.5 MG PO TBCR
12.5000 mg | EXTENDED_RELEASE_TABLET | Freq: Every evening | ORAL | 2 refills | Status: DC | PRN
  Filled 2023-04-17 – 2023-04-19 (×2): qty 30, 30d supply, fill #0
  Filled 2023-05-21: qty 30, 30d supply, fill #1
  Filled 2023-06-24: qty 30, 30d supply, fill #2

## 2023-04-17 NOTE — Progress Notes (Signed)
Celso Amy, PA-C [address]  [address]  [place], [place] [address]  Main: [phone]  Fax: [phone]   Primary Care Physician: Allegra Grana, FNP  Primary Gastroenterologist:  Celso Amy, PA-C   CC: Follow-up anal fissure, hemorrhoids, constipation  HPI: Denise Gaines is a 47 y.o. female returns for follow-up of anal fissure, hemorrhoids, and constipation.  She is currently taking lactulose once daily, MiraLAX once daily, and stool softener once daily.  On this treatment her constipation has greatly improved.  Currently having soft bowel movement every day with no hard stools or straining.  Denies adverse side effects.  Her anal fissure and rectal pain have greatly improved.  She has no more rectal pain.  She used diltiazem lidocaine cream with great benefit.  She has noticed a swollen hemorrhoid in the past week which is not bleeding.   Colonoscopy 09/2019 showed 1 small 6 mm sessile serrated adenoma polyp removed from transverse colon, hemorrhoids, anal fissure, poor prep.   Colonoscopy 02/2021 showed 4 mm tubular adenoma polyp removed from cecum.  Good prep.  Nonthrombosed external hemorrhoids and nonbleeding internal hemorrhoids.  Repeat colonoscopy in 5 years (due 02/2026).  Current Outpatient Medications  Medication Sig Dispense Refill   fluticasone (FLONASE) 50 MCG/ACT nasal spray Place 2 sprays into both nostrils daily. 16 g 6   hydrocortisone (ANUSOL-HC) 25 MG suppository Place 1 suppository (25 mg total) rectally at bedtime for 24 days. 12 suppository 1   hydrOXYzine (ATARAX) 10 MG tablet Take 1 tablet (10 mg total) by mouth 2 (two) times daily as needed for anxiety. 180 tablet 2   Lactulose 20 GM/30ML SOLN Take 30 mLs (20 g total) by mouth daily. 900 mL 5   levonorgestrel (MIRENA) 20 MCG/24HR IUD 1 each by Intrauterine route once.     PARoxetine (PAXIL) 40 MG tablet Take 1 tablet (40 mg total) by mouth every morning. 90 tablet 2   polyethylene glycol  (MIRALAX / GLYCOLAX) 17 g packet Take 17 g by mouth daily as needed. 14 each 1   tirzepatide (ZEPBOUND) 10 MG/0.5ML Pen Inject 10 mg into the skin once a week. 3 mL 2   zolpidem (AMBIEN CR) 12.5 MG CR tablet Take 1 tablet (12.5 mg total) by mouth at bedtime as needed for sleep. 30 tablet 2   No current facility-administered medications for this visit.    Allergies as of 04/18/2023   (No Known Allergies)    Past Medical History:  Diagnosis Date   Depression    Obesity     Past Surgical History:  Procedure Laterality Date   BREAST BIOPSY Right 2017   NEG   COLONOSCOPY WITH PROPOFOL N/A 09/23/2019   Procedure: COLONOSCOPY WITH PROPOFOL;  Surgeon: Pasty Spillers, MD;  Location: [facility] END[facility]COPY;  Service: Endoscopy;  Laterality: N/A;   COLONOSCOPY WITH PROPOFOL N/A 03/02/2021   Procedure: COLONOSCOPY WITH PROPOFOL;  Surgeon: Pasty Spillers, MD;  Location: [facility];  Service: Endoscopy;  Laterality: N/A;   NASAL SINUS SURGERY  [phone]    Review of Systems:    All systems reviewed and negative except where noted in HPI.   Physical Examination:   BP 118/75   Pulse 73   Temp 97.7 F (36.5 C)   Ht 5\' 6"  (1.676 m)   Wt 216 lb (98 kg)   LMP  (LMP Unknown)   BMI 34.86 kg/m   General: Well-nourished, well-developed in no acute distress.  Abdomen: Bowel sounds are normal; Abdomen is  Soft; No hepatosplenomegaly, masses or hernias;  No Abdominal Tenderness; No guarding or rebound tenderness. Rectal: 1 swollen external hemorrhoid size of a dime at the anus.  Several other smaller hemorrhoid skin tags.  Nonthrombosed.  No anal tenderness or rashes.  Digital rectal exam not performed.  Imaging Studies: No results found.  Assessment and Plan:   Denise Gaines is a 47 y.o. y/o female returns for follow-up of anal fissure, chronic constipation, and hemorrhoids.  Symptoms have greatly improved on treatment.  1.  Anal fissure -greatly improved, asymptomatic 2.  Chronic  constipation -improved and controlled on treatment 3.  External hemorrhoids; swollen but not thrombosed  Plan: -Rx hydrocortisone suppository 1 into the rectum nightly x 12 days with 1 refill. -Continue lactulose, MiraLAX, and stool softener daily. -Use diltiazem/lidocaine cream as needed for anal fissure. -Discussed internal hemorrhoid banding and external hemorrhoid surgery as a last resort if conservative treatment fails.  Celso Amy, PA-C  Follow up as needed if symptoms return or worsen.

## 2023-04-17 NOTE — Progress Notes (Unsigned)
Verbal consent for services obtained from patient prior to services given to TELEPHONE visit:   Location of call:  provider at work patient at home  Names of all persons present for services: Rennie Plowman, NP and patient  She feels well and feeling very 'even' on Paxil.   She is sleeping well on ambien 12.5mg   Anal fissure has improved and has follow up tomorrow with [facility].   She has chronic constipation and doesn't feel zepbound r/t constipation.   Weight loss has plateued and she is interested in increasing zepbound.    Wt Readings from Last 3 Encounters:  04/18/23 216 lb (98 kg)  04/17/23 214 lb (97.1 kg)  03/07/23 228 lb (103.4 kg)    A/P/next steps:  Class 3 severe obesity due to excess calories with serious comorbidity in adult, unspecified BMI (HCC) Assessment & Plan: She is tolerating medication. Increase zepbound to 10mg .  Orders: -     Zepbound; Inject 10 mg into the skin once a week.  Dispense: 3 mL; Refill: 2  Sleep disturbance Assessment & Plan: Chronic, stable. Continue Ambien 12.5mg  .   Orders: -     Zolpidem Tartrate ER; Take 1 tablet (12.5 mg total) by mouth at bedtime as needed for sleep.  Dispense: 30 tablet; Refill: 2  Depression, unspecified depression type Assessment & Plan: Chronic, stable.  Continue Paxil 40 mg daily, Atarax 10 mg twice daily as needed.        Advised patient AVS could be mailed or viewed over MyChart if MyChart user.   I spent 15 min  discussing plan of care over the phone.

## 2023-04-18 ENCOUNTER — Other Ambulatory Visit: Payer: Self-pay

## 2023-04-18 ENCOUNTER — Ambulatory Visit: Payer: 59 | Admitting: Physician Assistant

## 2023-04-18 ENCOUNTER — Encounter: Payer: Self-pay | Admitting: Physician Assistant

## 2023-04-18 VITALS — BP 118/75 | HR 73 | Temp 97.7°F | Ht 66.0 in | Wt 216.0 lb

## 2023-04-18 DIAGNOSIS — K5909 Other constipation: Secondary | ICD-10-CM

## 2023-04-18 DIAGNOSIS — K644 Residual hemorrhoidal skin tags: Secondary | ICD-10-CM

## 2023-04-18 DIAGNOSIS — K649 Unspecified hemorrhoids: Secondary | ICD-10-CM

## 2023-04-18 DIAGNOSIS — K602 Anal fissure, unspecified: Secondary | ICD-10-CM

## 2023-04-18 DIAGNOSIS — K5904 Chronic idiopathic constipation: Secondary | ICD-10-CM

## 2023-04-18 MED ORDER — HYDROCORTISONE ACETATE 25 MG RE SUPP
25.0000 mg | Freq: Every day | RECTAL | 1 refills | Status: DC
  Filled 2023-04-18: qty 12, 12d supply, fill #0
  Filled 2023-05-06: qty 12, 12d supply, fill #1

## 2023-04-18 NOTE — Patient Instructions (Signed)
For External Hemorrhoids: Warm water sitz bath with epsom salt for flare up of external hemorrhoids. Use OTC Preparation H, Tucks Pads, and Witch Hazel wipes as needed. Discussed referral for Surgery as a last resort if Conservative treatment fails.  For Internal Hemorrhoids: Rx Hydrocortisone Suppositories 25mg  Insert 1 into rectum once daily at bedtime for 10-14 days. Stressed importance of treating underlying constipation. Avoid Sitting on the toilet for prolonged amount of time. Discussed Internal Hemorrhoid Banding if no improvement with conservative treament.

## 2023-04-18 NOTE — Assessment & Plan Note (Signed)
She is tolerating medication. Increase zepbound to 10mg .

## 2023-04-18 NOTE — Assessment & Plan Note (Signed)
Chronic, stable.  Continue Paxil 40 mg daily, Atarax 10 mg twice daily as needed.

## 2023-04-18 NOTE — Assessment & Plan Note (Signed)
Chronic, stable.  Continue Ambien 12.5 mg

## 2023-04-19 ENCOUNTER — Other Ambulatory Visit: Payer: Self-pay

## 2023-04-22 ENCOUNTER — Other Ambulatory Visit: Payer: Self-pay

## 2023-04-23 ENCOUNTER — Other Ambulatory Visit: Payer: Self-pay

## 2023-05-03 ENCOUNTER — Encounter: Payer: Self-pay | Admitting: Physician Assistant

## 2023-05-06 ENCOUNTER — Other Ambulatory Visit: Payer: Self-pay

## 2023-05-21 ENCOUNTER — Other Ambulatory Visit: Payer: Self-pay

## 2023-05-24 ENCOUNTER — Other Ambulatory Visit: Payer: Self-pay

## 2023-06-24 ENCOUNTER — Other Ambulatory Visit: Payer: Self-pay

## 2023-07-04 ENCOUNTER — Other Ambulatory Visit: Payer: Self-pay

## 2023-07-04 ENCOUNTER — Telehealth: Payer: 59 | Admitting: Physician Assistant

## 2023-07-04 DIAGNOSIS — J019 Acute sinusitis, unspecified: Secondary | ICD-10-CM | POA: Diagnosis not present

## 2023-07-04 DIAGNOSIS — B9689 Other specified bacterial agents as the cause of diseases classified elsewhere: Secondary | ICD-10-CM

## 2023-07-04 MED ORDER — AMOXICILLIN-POT CLAVULANATE 875-125 MG PO TABS
1.0000 | ORAL_TABLET | Freq: Two times a day (BID) | ORAL | 0 refills | Status: DC
  Filled 2023-07-04: qty 14, 7d supply, fill #0

## 2023-07-04 NOTE — Progress Notes (Signed)
 I have spent 5 minutes in review of e-visit questionnaire, review and updating patient chart, medical decision making and response to patient.   Piedad Climes, PA-C

## 2023-07-04 NOTE — Progress Notes (Signed)

## 2023-07-23 ENCOUNTER — Encounter: Payer: Self-pay | Admitting: Family

## 2023-07-23 ENCOUNTER — Other Ambulatory Visit: Payer: Self-pay | Admitting: Family

## 2023-07-23 ENCOUNTER — Other Ambulatory Visit: Payer: Self-pay

## 2023-07-23 DIAGNOSIS — G479 Sleep disorder, unspecified: Secondary | ICD-10-CM

## 2023-07-23 MED ORDER — ZOLPIDEM TARTRATE ER 12.5 MG PO TBCR
12.5000 mg | EXTENDED_RELEASE_TABLET | Freq: Every evening | ORAL | 2 refills | Status: DC | PRN
  Filled 2023-07-23 (×2): qty 30, 30d supply, fill #0
  Filled 2023-08-22: qty 30, 30d supply, fill #1
  Filled 2023-09-20: qty 30, 30d supply, fill #2

## 2023-08-01 ENCOUNTER — Other Ambulatory Visit: Payer: Self-pay

## 2023-08-01 ENCOUNTER — Encounter: Payer: Self-pay | Admitting: Family

## 2023-08-01 ENCOUNTER — Ambulatory Visit: Payer: Self-pay | Admitting: Family

## 2023-08-01 VITALS — BP 120/72 | HR 87 | Temp 98.9°F | Ht 66.0 in | Wt 211.4 lb

## 2023-08-01 DIAGNOSIS — F32A Depression, unspecified: Secondary | ICD-10-CM

## 2023-08-01 DIAGNOSIS — J320 Chronic maxillary sinusitis: Secondary | ICD-10-CM | POA: Diagnosis not present

## 2023-08-01 DIAGNOSIS — J329 Chronic sinusitis, unspecified: Secondary | ICD-10-CM | POA: Insufficient documentation

## 2023-08-01 MED ORDER — DOXYCYCLINE HYCLATE 100 MG PO TABS
100.0000 mg | ORAL_TABLET | Freq: Two times a day (BID) | ORAL | 0 refills | Status: AC
  Filled 2023-08-01: qty 14, 7d supply, fill #0

## 2023-08-01 MED ORDER — BUPROPION HCL ER (XL) 150 MG PO TB24
150.0000 mg | ORAL_TABLET | Freq: Every day | ORAL | 3 refills | Status: DC
  Filled 2023-08-01: qty 90, 90d supply, fill #0

## 2023-08-01 NOTE — Patient Instructions (Signed)
 Continue paxil 20mg ; we may wean off.   Start wellbutrin 150mg  and after 2-3 may increase 300mg  daily.

## 2023-08-01 NOTE — Progress Notes (Unsigned)
   Assessment & Plan:  There are no diagnoses linked to this encounter.   Return precautions given.   Risks, benefits, and alternatives of the medications and treatment plan prescribed today were discussed, and patient expressed understanding.   Education regarding symptom management and diagnosis given to patient on AVS either electronically or printed.  No follow-ups on file.  Rennie Plowman, FNP  Subjective:    Patient ID: Denise Gaines, female    DOB: 04-Feb-1976, 48 y.o.   MRN: [id number]  CC: Denise Gaines is a 48 y.o. female who presents today for follow up.   HPI: She desxribes episodic 'tanking'  every 6-8 weeks for several months.    Describes as a 'disinterest' Symptom will last approx 3 weeks until resolved.   During that time, she doesn't do spin class.  She notices increased appetite during that time   Denies irritablity, fatigue  She feels symptom is less physical , more mental.    She is doing well on [place].   Hot sweats have resolved.   Started augmentin 07/04/23 for sinusitis  COmplaint with paxil 20mg     Tried celexa , zoloft, wellbutrin.  Complains of left sided sinus congestion.   SOme improvement after augmentin.   Seen by ENT and cultured, and Dr Andee Poles started levaquin.   Denies F, sore throat, cough  H/o sinus surgery  Allergies: Patient has no known allergies. Current Outpatient Medications on File Prior to Visit  Medication Sig Dispense Refill   amoxicillin-clavulanate (AUGMENTIN) 875-125 MG tablet Take 1 tablet by mouth 2 (two) times daily. 14 tablet 0   fluticasone (FLONASE) 50 MCG/ACT nasal spray Place 2 sprays into both nostrils daily. 16 g 6   hydrOXYzine (ATARAX) 10 MG tablet Take 1 tablet (10 mg total) by mouth 2 (two) times daily as needed for anxiety. 180 tablet 2   Lactulose 20 GM/30ML SOLN Take 30 mLs (20 g total) by mouth daily. 900 mL 5   levonorgestrel (MIRENA) 20 MCG/24HR IUD 1 each by Intrauterine route once.      PARoxetine (PAXIL) 40 MG tablet Take 1 tablet (40 mg total) by mouth every morning. 90 tablet 2   polyethylene glycol (MIRALAX / GLYCOLAX) 17 g packet Take 17 g by mouth daily as needed. 14 each 1   tirzepatide (ZEPBOUND) 10 MG/0.5ML Pen Inject 10 mg into the skin once a week. 3 mL 2   zolpidem (AMBIEN CR) 12.5 MG CR tablet Take 1 tablet (12.5 mg total) by mouth at bedtime as needed for sleep. 30 tablet 2   No current facility-administered medications on file prior to visit.    Review of Systems    Objective:    There were no vitals taken for this visit. BP Readings from Last 3 Encounters:  04/18/23 118/75  03/07/23 125/84  12/19/22 118/76   Wt Readings from Last 3 Encounters:  04/18/23 216 lb (98 kg)  04/17/23 214 lb (97.1 kg)  03/07/23 228 lb (103.4 kg)    Physical Exam

## 2023-08-02 NOTE — Assessment & Plan Note (Addendum)
 Uncontrolled.  Discussed menopausal symptoms.  First-degree relative (mother) with breast cancer.  Discussed HRT and risk of breast cancer.    Continue decreased dose of Paxil 20 mg daily and may consider weaning off completely if medication is not effective.  Start Wellbutrin 150 mg and titrate to 300.  Discussed monitoring for increased anxiety or irritability.

## 2023-08-02 NOTE — Assessment & Plan Note (Signed)
 Afebrile.  Nontoxic in appearance.  Recently treated with Augmentin.  Start doxycycline.  Advised to reserve Levaquin if doxycycline were to fail due to blackbox warning of  rare irreversible tendon rupture.  Advise probiotics.

## 2023-08-23 ENCOUNTER — Other Ambulatory Visit: Payer: Self-pay

## 2023-09-19 ENCOUNTER — Other Ambulatory Visit (HOSPITAL_COMMUNITY): Payer: Self-pay

## 2023-09-20 ENCOUNTER — Encounter: Payer: Self-pay | Admitting: Family

## 2023-09-20 ENCOUNTER — Other Ambulatory Visit: Payer: Self-pay

## 2023-09-20 MED ORDER — BUPROPION HCL ER (XL) 300 MG PO TB24
300.0000 mg | ORAL_TABLET | Freq: Every day | ORAL | 3 refills | Status: AC
  Filled 2023-09-20: qty 90, 90d supply, fill #0
  Filled 2023-12-18 (×2): qty 90, 90d supply, fill #1
  Filled 2024-03-20: qty 90, 90d supply, fill #2

## 2023-09-20 NOTE — Telephone Encounter (Signed)
 New rx Wellbutrin  sent in to pharmacy 90 day with 3 refills

## 2023-10-15 ENCOUNTER — Other Ambulatory Visit: Payer: Self-pay

## 2023-10-15 ENCOUNTER — Other Ambulatory Visit: Payer: Self-pay | Admitting: Family

## 2023-10-15 DIAGNOSIS — G479 Sleep disorder, unspecified: Secondary | ICD-10-CM

## 2023-10-16 ENCOUNTER — Other Ambulatory Visit: Payer: Self-pay

## 2023-10-16 MED ORDER — ZOLPIDEM TARTRATE ER 12.5 MG PO TBCR
12.5000 mg | EXTENDED_RELEASE_TABLET | Freq: Every evening | ORAL | 2 refills | Status: DC | PRN
  Filled 2023-10-16 – 2023-10-18 (×2): qty 30, 30d supply, fill #0
  Filled 2023-11-20: qty 30, 30d supply, fill #1
  Filled 2023-12-18 (×2): qty 30, 30d supply, fill #2

## 2023-10-17 ENCOUNTER — Other Ambulatory Visit: Payer: Self-pay | Admitting: Family

## 2023-10-17 ENCOUNTER — Encounter: Payer: Self-pay | Admitting: Family

## 2023-10-17 ENCOUNTER — Other Ambulatory Visit: Payer: Self-pay

## 2023-10-17 DIAGNOSIS — K130 Diseases of lips: Secondary | ICD-10-CM

## 2023-10-17 MED ORDER — TRIAMCINOLONE ACETONIDE 0.025 % EX OINT
1.0000 | TOPICAL_OINTMENT | Freq: Two times a day (BID) | CUTANEOUS | 1 refills | Status: AC
  Filled 2023-10-17: qty 30, 15d supply, fill #0
  Filled 2023-11-20: qty 30, 15d supply, fill #1

## 2023-10-18 ENCOUNTER — Other Ambulatory Visit: Payer: Self-pay

## 2023-10-30 NOTE — Telephone Encounter (Unsigned)
 Copied from CRM [id number]. Topic: Clinical - Medical Advice >> Oct 30, 2023 12:46 PM Denise Gaines wrote: Reason for CRM: Patient is calling in regarding her lips, patient wanted an appointment but none were available this week. Patient stated her lips have not improved but gotten worse. Patient would like to see if [facility] could do anything.

## 2023-11-20 ENCOUNTER — Other Ambulatory Visit: Payer: Self-pay

## 2023-12-04 ENCOUNTER — Other Ambulatory Visit: Payer: Self-pay | Admitting: Family

## 2023-12-04 ENCOUNTER — Other Ambulatory Visit: Payer: Self-pay

## 2023-12-04 DIAGNOSIS — K649 Unspecified hemorrhoids: Secondary | ICD-10-CM

## 2023-12-04 MED ORDER — HYDROCORTISONE ACETATE 25 MG RE SUPP
25.0000 mg | Freq: Two times a day (BID) | RECTAL | 0 refills | Status: AC
  Filled 2023-12-04: qty 12, 6d supply, fill #0

## 2023-12-05 ENCOUNTER — Other Ambulatory Visit: Payer: Self-pay

## 2023-12-18 ENCOUNTER — Other Ambulatory Visit: Payer: Self-pay

## 2024-01-01 ENCOUNTER — Other Ambulatory Visit: Payer: Self-pay | Admitting: Family

## 2024-01-01 DIAGNOSIS — Z1231 Encounter for screening mammogram for malignant neoplasm of breast: Secondary | ICD-10-CM

## 2024-01-10 ENCOUNTER — Other Ambulatory Visit: Payer: Self-pay

## 2024-01-14 ENCOUNTER — Other Ambulatory Visit: Payer: Self-pay | Admitting: Family

## 2024-01-14 ENCOUNTER — Other Ambulatory Visit: Payer: Self-pay

## 2024-01-14 DIAGNOSIS — G479 Sleep disorder, unspecified: Secondary | ICD-10-CM

## 2024-01-15 ENCOUNTER — Other Ambulatory Visit: Payer: Self-pay

## 2024-01-15 MED FILL — Zolpidem Tartrate Tab ER 12.5 MG: ORAL | 30 days supply | Qty: 30 | Fill #0 | Status: CN

## 2024-01-17 ENCOUNTER — Other Ambulatory Visit: Payer: Self-pay

## 2024-01-18 ENCOUNTER — Other Ambulatory Visit: Payer: Self-pay

## 2024-01-18 MED FILL — Zolpidem Tartrate Tab ER 12.5 MG: ORAL | 30 days supply | Qty: 30 | Fill #0 | Status: CN

## 2024-01-20 ENCOUNTER — Other Ambulatory Visit: Payer: Self-pay

## 2024-01-20 ENCOUNTER — Other Ambulatory Visit (HOSPITAL_BASED_OUTPATIENT_CLINIC_OR_DEPARTMENT_OTHER): Payer: Self-pay

## 2024-01-20 MED FILL — Zolpidem Tartrate Tab ER 12.5 MG: ORAL | 30 days supply | Qty: 30 | Fill #0 | Status: AC

## 2024-01-22 ENCOUNTER — Ambulatory Visit
Admission: RE | Admit: 2024-01-22 | Discharge: 2024-01-22 | Disposition: A | Discharge status: Home / Self Care | Source: Ambulatory Visit | Attending: Family | Admitting: Family

## 2024-01-22 DIAGNOSIS — Z1231 Encounter for screening mammogram for malignant neoplasm of breast: Secondary | ICD-10-CM | POA: Insufficient documentation

## 2024-02-18 MED FILL — Zolpidem Tartrate Tab ER 12.5 MG: ORAL | 30 days supply | Qty: 30 | Fill #1 | Status: AC

## 2024-02-19 ENCOUNTER — Other Ambulatory Visit: Payer: Self-pay

## 2024-02-26 ENCOUNTER — Other Ambulatory Visit: Payer: Self-pay

## 2024-02-26 ENCOUNTER — Encounter: Payer: Self-pay | Admitting: Family

## 2024-02-26 ENCOUNTER — Other Ambulatory Visit (HOSPITAL_COMMUNITY)
Admission: RE | Admit: 2024-02-26 | Discharge: 2024-02-26 | Disposition: A | Discharge status: Home / Self Care | Source: Ambulatory Visit | Attending: Family | Admitting: Family

## 2024-02-26 ENCOUNTER — Ambulatory Visit (INDEPENDENT_AMBULATORY_CARE_PROVIDER_SITE_OTHER): Admitting: Family

## 2024-02-26 VITALS — BP 122/76 | HR 98 | Temp 97.7°F | Ht 62.0 in | Wt 202.6 lb

## 2024-02-26 DIAGNOSIS — Z13220 Encounter for screening for lipoid disorders: Secondary | ICD-10-CM

## 2024-02-26 DIAGNOSIS — F32A Depression, unspecified: Secondary | ICD-10-CM | POA: Diagnosis not present

## 2024-02-26 DIAGNOSIS — Z0000 Encounter for general adult medical examination without abnormal findings: Secondary | ICD-10-CM | POA: Diagnosis not present

## 2024-02-26 DIAGNOSIS — Z136 Encounter for screening for cardiovascular disorders: Secondary | ICD-10-CM

## 2024-02-26 DIAGNOSIS — N951 Menopausal and female climacteric states: Secondary | ICD-10-CM | POA: Diagnosis not present

## 2024-02-26 MED ORDER — PAROXETINE HCL 20 MG PO TABS
20.0000 mg | ORAL_TABLET | Freq: Every morning | ORAL | 3 refills | Status: AC
  Filled 2024-02-26: qty 90, 90d supply, fill #0
  Filled 2024-05-21: qty 90, 90d supply, fill #1

## 2024-02-26 NOTE — Assessment & Plan Note (Signed)
CBE and pap smear performed.

## 2024-02-26 NOTE — Progress Notes (Signed)
 Assessment & Plan:  Annual physical exam Assessment & Plan: CBE and pap smear performed.   Orders: -     VITAMIN D  25 Hydroxy (Vit-D Deficiency, Fractures) -     Hemoglobin A1c -     CBC with Differential/Platelet -     Comprehensive metabolic panel with GFR -     Lipid panel -     TSH -     Cytology - PAP  Vasomotor symptoms due to menopause Assessment & Plan: No B symptoms. Restart paxil  20mg . Advised trial otc black cohosh.  Close follow up.   Orders: -     CBC with Differential/Platelet -     TSH -     PARoxetine  HCl; Take 1 tablet (20 mg total) by mouth every morning.  Dispense: 90 tablet; Refill: 3  Depression, unspecified depression type Assessment & Plan: Chronic, stable. Continue wellbutrin  300mg  every day.   Orders: -     TSH  Encounter for lipid screening for cardiovascular disease -     Lipid panel     Return precautions given.   Risks, benefits, and alternatives of the medications and treatment plan prescribed today were discussed, and patient expressed understanding.   Education regarding symptom management and diagnosis given to patient on AVS either electronically or printed.  Return in about 3 months (around 05/28/2024).  Rollene Northern, FNP  Subjective:    Patient ID: Denise Gaines, female    DOB: [birth year], 48 y.o.   MRN: [id number]  CC: Denise Gaines is a 48 y.o. female who presents today for physical exam.    HPI: Complains of hot flashes.  She is no longer on paxil . She is pleased with wellbutrin  300mg .  She has episodic 'hot flashes' and describes as a 'wave of heat'. Hot flashes had been worse during the heat this summer, since improved.  No unusual weight loss, bone pain. She is not drenching the sheets in sweat.     Colorectal Cancer Screening: UTD , Dr Janalyn; repeat in 5 years Breast Cancer Screening: Mammogram UTD Cervical Cancer Screening: due; NILM, neg HPV 10/20/20 Bone Health screening/DEXA for 65+: No increased  fracture risk. Defer screening at this time.        Tetanus - UTD   Exercise: Gets regular exercise.   Alcohol use:  none Smoking/tobacco use: Nonsmoker.    Health Maintenance  Topic Date Due   Hepatitis B Vaccine (1 of 3 - 19+ 3-dose series) Never done   COVID-19 Vaccine (3 - Pfizer risk series) 06/16/2019   Pap with HPV screening  10/20/2025   Breast Cancer Screening  01/21/2026   Colon Cancer Screening  03/02/2026   DTaP/Tdap/Td vaccine (2 - Td or Tdap) 12/09/2026   Flu Shot  Completed   Hepatitis C Screening  Completed   HPV Vaccine  Aged Out   Meningitis B Vaccine  Aged Out   Pneumococcal Vaccine  Discontinued   HIV Screening  Discontinued    ALLERGIES: Patient has no known allergies.  Current Outpatient Medications on File Prior to Visit  Medication Sig Dispense Refill   buPROPion  (WELLBUTRIN  XL) 300 MG 24 hr tablet Take 1 tablet (300 mg total) by mouth daily. 90 tablet 3   fluticasone  (FLONASE ) 50 MCG/ACT nasal spray Place 2 sprays into both nostrils daily. 16 g 6   hydrOXYzine  (ATARAX ) 10 MG tablet Take 1 tablet (10 mg total) by mouth 2 (two) times daily as needed for anxiety. 180 tablet 2   levonorgestrel  (MIRENA ) 20  MCG/24HR IUD 1 each by Intrauterine route once.     polyethylene glycol (MIRALAX  / GLYCOLAX ) 17 g packet Take 17 g by mouth daily as needed. 14 each 1   tirzepatide  (ZEPBOUND ) 10 MG/0.5ML Pen Inject 10 mg into the skin once a week. 3 mL 2   triamcinolone  (KENALOG ) 0.025 % ointment Apply 1 Application topically 2 (two) times daily. Use sparingly < 1 week. 30 g 1   zolpidem  (AMBIEN  CR) 12.5 MG CR tablet Take 1 tablet (12.5 mg total) by mouth at bedtime as needed for sleep. 30 tablet 2   No current facility-administered medications on file prior to visit.    Review of Systems  Constitutional:  Negative for chills, fever and unexpected weight change.  HENT:  Negative for congestion.   Respiratory:  Negative for cough.   Cardiovascular:  Negative for chest  pain, palpitations and leg swelling.  Gastrointestinal:  Negative for nausea and vomiting.  Musculoskeletal:  Negative for arthralgias and myalgias.  Skin:  Negative for rash.  Neurological:  Negative for headaches.  Hematological:  Negative for adenopathy.  Psychiatric/Behavioral:  Negative for confusion and sleep disturbance. The patient is not nervous/anxious.       Objective:    BP 122/76   Pulse 98   Temp 97.7 F (36.5 C) (Oral)   Ht 5' 2 (1.575 m)   Wt 202 lb 9.6 oz (91.9 kg)   LMP  (LMP Unknown)   SpO2 97%   BMI 37.06 kg/m   BP Readings from Last 3 Encounters:  02/26/24 122/76  08/01/23 120/72  04/18/23 118/75   Wt Readings from Last 3 Encounters:  02/26/24 202 lb 9.6 oz (91.9 kg)  08/01/23 211 lb 6.4 oz (95.9 kg)  04/18/23 216 lb (98 kg)    Physical Exam Vitals reviewed.  Constitutional:      Appearance: Normal appearance. She is well-developed.  Eyes:     Conjunctiva/sclera: Conjunctivae normal.  Neck:     Thyroid : No thyroid  mass or thyromegaly.  Cardiovascular:     Rate and Rhythm: Normal rate and regular rhythm.     Pulses: Normal pulses.     Heart sounds: Normal heart sounds.  Pulmonary:     Effort: Pulmonary effort is normal.     Breath sounds: Normal breath sounds. No wheezing, rhonchi or rales.  Chest:  Breasts:    Breasts are symmetrical.     Right: No inverted nipple, mass, nipple discharge, skin change or tenderness.     Left: No inverted nipple, mass, nipple discharge, skin change or tenderness.  Abdominal:     General: Bowel sounds are normal. There is no distension.     Palpations: Abdomen is soft. Abdomen is not rigid. There is no fluid wave or mass.     Tenderness: There is no abdominal tenderness. There is no guarding or rebound.  Genitourinary:    Cervix: No cervical motion tenderness, discharge or friability.     Uterus: Not enlarged, not fixed and not tender.      Adnexa:        Right: No mass, tenderness or fullness.          Left: No mass, tenderness or fullness.       Comments: Pap performed. 2 IUD strings coming from cervical os. No CMT. Unable to appreciated ovaries. Lymphadenopathy:     Head:     Right side of head: No submental, submandibular, tonsillar, preauricular, posterior auricular or occipital adenopathy.     Left side  of head: No submental, submandibular, tonsillar, preauricular, posterior auricular or occipital adenopathy.     Cervical:     Right cervical: No superficial, deep or posterior cervical adenopathy.    Left cervical: No superficial, deep or posterior cervical adenopathy.     Upper Body:     Right upper body: No pectoral adenopathy.     Left upper body: No pectoral adenopathy.  Skin:    General: Skin is warm and dry.  Neurological:     Mental Status: She is alert.  Psychiatric:        Speech: Speech normal.        Behavior: Behavior normal.        Thought Content: Thought content normal.

## 2024-02-26 NOTE — Addendum Note (Signed)
 Addended by: DINEEN ROLLENE MATSU on: 02/26/2024 10:47 AM   Modules accepted: Orders

## 2024-02-26 NOTE — Assessment & Plan Note (Signed)
 Chronic, stable. Continue wellbutrin  300mg  every day.

## 2024-02-26 NOTE — Patient Instructions (Addendum)
 Restart paxil  20mg  for hot flashes  Consider OTC black cohosh as well  Let me know how you are doing.   Health Maintenance for Postmenopausal Women Menopause is a normal process in which your ability to get pregnant comes to an end. This process happens slowly over many months or years, usually between the ages of 57 and 65. Menopause is complete when you have missed your menstrual period for 12 months. It is important to talk with your health care provider about some of the most common conditions that affect women after menopause (postmenopausal women). These include heart disease, cancer, and bone loss (osteoporosis). Adopting a healthy lifestyle and getting preventive care can help to promote your health and wellness. The actions you take can also lower your chances of developing some of these common conditions. What are the signs and symptoms of menopause? During menopause, you may have the following symptoms: Hot flashes. These can be moderate or severe. Night sweats. Decrease in sex drive. Mood swings. Headaches. Tiredness (fatigue). Irritability. Memory problems. Problems falling asleep or staying asleep. Talk with your health care provider about treatment options for your symptoms. Do I need hormone replacement therapy? Hormone replacement therapy is effective in treating symptoms that are caused by menopause, such as hot flashes and night sweats. Hormone replacement carries certain risks, especially as you become older. If you are thinking about using estrogen or estrogen with progestin, discuss the benefits and risks with your health care provider. How can I reduce my risk for heart disease and stroke? The risk of heart disease, heart attack, and stroke increases as you age. One of the causes may be a change in the body's hormones during menopause. This can affect how your body uses dietary fats, triglycerides, and cholesterol. Heart attack and stroke are medical emergencies. There  are many things that you can do to help prevent heart disease and stroke. Watch your blood pressure High blood pressure causes heart disease and increases the risk of stroke. This is more likely to develop in people who have high blood pressure readings or are overweight. Have your blood pressure checked: Every 3-5 years if you are 36-48 years of age. Every year if you are 75 years old or older. Eat a healthy diet  Eat a diet that includes plenty of vegetables, fruits, low-fat dairy products, and lean protein. Do not eat a lot of foods that are high in solid fats, added sugars, or sodium. Get regular exercise Get regular exercise. This is one of the most important things you can do for your health. Most adults should: Try to exercise for at least 150 minutes each week. The exercise should increase your heart rate and make you sweat (moderate-intensity exercise). Try to do strengthening exercises at least twice each week. Do these in addition to the moderate-intensity exercise. Spend less time sitting. Even light physical activity can be beneficial. Other tips Work with your health care provider to achieve or maintain a healthy weight. Do not use any products that contain nicotine or tobacco. These products include cigarettes, chewing tobacco, and vaping devices, such as e-cigarettes. If you need help quitting, ask your health care provider. Know your numbers. Ask your health care provider to check your cholesterol and your blood sugar (glucose). Continue to have your blood tested as directed by your health care provider. Do I need screening for cancer? Depending on your health history and family history, you may need to have cancer screenings at different stages of your life. This  may include screening for: Breast cancer. Cervical cancer. Lung cancer. Colorectal cancer. What is my risk for osteoporosis? After menopause, you may be at increased risk for osteoporosis. Osteoporosis is a  condition in which bone destruction happens more quickly than new bone creation. To help prevent osteoporosis or the bone fractures that can happen because of osteoporosis, you may take the following actions: If you are 88-58 years old, get at least 1,000 mg of calcium and at least 600 international units (IU) of vitamin D  per day. If you are older than age 83 but younger than age 78, get at least 1,200 mg of calcium and at least 600 international units (IU) of vitamin D  per day. If you are older than age 47, get at least 1,200 mg of calcium and at least 800 international units (IU) of vitamin D  per day. Smoking and drinking excessive alcohol increase the risk of osteoporosis. Eat foods that are rich in calcium and vitamin D , and do weight-bearing exercises several times each week as directed by your health care provider. How does menopause affect my mental health? Depression may occur at any age, but it is more common as you become older. Common symptoms of depression include: Feeling depressed. Changes in sleep patterns. Changes in appetite or eating patterns. Feeling an overall lack of motivation or enjoyment of activities that you previously enjoyed. Frequent crying spells. Talk with your health care provider if you think that you are experiencing any of these symptoms. General instructions See your health care provider for regular wellness exams and vaccines. This may include: Scheduling regular health, dental, and eye exams. Getting and maintaining your vaccines. These include: Influenza vaccine. Get this vaccine each year before the flu season begins. Pneumonia vaccine. Shingles vaccine. Tetanus, diphtheria, and pertussis (Tdap) booster vaccine. Your health care provider may also recommend other immunizations. Tell your health care provider if you have ever been abused or do not feel safe at home. Summary Menopause is a normal process in which your ability to get pregnant comes to an  end. This condition causes hot flashes, night sweats, decreased interest in sex, mood swings, headaches, or lack of sleep. Treatment for this condition may include hormone replacement therapy. Take actions to keep yourself healthy, including exercising regularly, eating a healthy diet, watching your weight, and checking your blood pressure and blood sugar levels. Get screened for cancer and depression. Make sure that you are up to date with all your vaccines. This information is not intended to replace advice given to you by your health care provider. Make sure you discuss any questions you have with your health care provider. Document Revised: 09/12/2020 Document Reviewed: 09/12/2020 Elsevier Patient Education  2024 [employer].

## 2024-02-26 NOTE — Assessment & Plan Note (Signed)
 No B symptoms. Restart paxil  20mg . Advised trial otc black cohosh.  Close follow up.

## 2024-02-28 LAB — CYTOLOGY - PAP
Comment: NEGATIVE
Diagnosis: UNDETERMINED — AB
High risk HPV: NEGATIVE

## 2024-03-03 ENCOUNTER — Encounter: Payer: Self-pay | Admitting: Family

## 2024-03-04 ENCOUNTER — Ambulatory Visit: Payer: Self-pay | Admitting: Family

## 2024-03-05 ENCOUNTER — Other Ambulatory Visit (INDEPENDENT_AMBULATORY_CARE_PROVIDER_SITE_OTHER)

## 2024-03-05 DIAGNOSIS — Z136 Encounter for screening for cardiovascular disorders: Secondary | ICD-10-CM

## 2024-03-05 DIAGNOSIS — Z13220 Encounter for screening for lipoid disorders: Secondary | ICD-10-CM | POA: Diagnosis not present

## 2024-03-05 DIAGNOSIS — N951 Menopausal and female climacteric states: Secondary | ICD-10-CM | POA: Diagnosis not present

## 2024-03-05 DIAGNOSIS — Z0000 Encounter for general adult medical examination without abnormal findings: Secondary | ICD-10-CM | POA: Diagnosis not present

## 2024-03-05 DIAGNOSIS — F32A Depression, unspecified: Secondary | ICD-10-CM | POA: Diagnosis not present

## 2024-03-05 LAB — CBC WITH DIFFERENTIAL/PLATELET
Basophils Absolute: 0.1 K/uL (ref 0.0–0.1)
Basophils Relative: 0.8 % (ref 0.0–3.0)
Eosinophils Absolute: 0.1 K/uL (ref 0.0–0.7)
Eosinophils Relative: 2 % (ref 0.0–5.0)
HCT: 37.7 % (ref 36.0–46.0)
Hemoglobin: 12.1 g/dL (ref 12.0–15.0)
Lymphocytes Relative: 27.8 % (ref 12.0–46.0)
Lymphs Abs: 2 K/uL (ref 0.7–4.0)
MCHC: 32.1 g/dL (ref 30.0–36.0)
MCV: 80 fl (ref 78.0–100.0)
Monocytes Absolute: 0.5 K/uL (ref 0.1–1.0)
Monocytes Relative: 6.8 % (ref 3.0–12.0)
Neutro Abs: 4.4 K/uL (ref 1.4–7.7)
Neutrophils Relative %: 62.6 % (ref 43.0–77.0)
Platelets: 299 K/uL (ref 150.0–400.0)
RBC: 4.71 Mil/uL (ref 3.87–5.11)
RDW: 16.2 % — ABNORMAL HIGH (ref 11.5–15.5)
WBC: 7 K/uL (ref 4.0–10.5)

## 2024-03-05 LAB — LIPID PANEL
Cholesterol: 144 mg/dL (ref 0–200)
HDL: 61.2 mg/dL (ref >39.00)
LDL Cholesterol: 72 mg/dL (ref 0–99)
NonHDL: 82.32
Total CHOL/HDL Ratio: 2
Triglycerides: 51 mg/dL (ref 0.0–149.0)
VLDL: 10.2 mg/dL (ref 0.0–40.0)

## 2024-03-05 LAB — COMPREHENSIVE METABOLIC PANEL WITH GFR
ALT: 10 U/L (ref 0–35)
AST: 14 U/L (ref 0–37)
Albumin: 4.2 g/dL (ref 3.5–5.2)
Alkaline Phosphatase: 92 U/L (ref 39–117)
BUN: 11 mg/dL (ref 6–23)
CO2: 27 meq/L (ref 19–32)
Calcium: 8.9 mg/dL (ref 8.4–10.5)
Chloride: 106 meq/L (ref 96–112)
Creatinine, Ser: 0.82 mg/dL (ref 0.40–1.20)
GFR: 83.81 mL/min (ref >60.00)
Glucose, Bld: 81 mg/dL (ref 70–99)
Potassium: 4.4 meq/L (ref 3.5–5.1)
Sodium: 140 meq/L (ref 135–145)
Total Bilirubin: 0.3 mg/dL (ref 0.2–1.2)
Total Protein: 6.2 g/dL (ref 6.0–8.3)

## 2024-03-05 LAB — HEMOGLOBIN A1C: Hgb A1c MFr Bld: 5.7 % (ref 4.6–6.5)

## 2024-03-05 LAB — VITAMIN D 25 HYDROXY (VIT D DEFICIENCY, FRACTURES): VITD: 36 ng/mL (ref 30.00–100.00)

## 2024-03-05 LAB — TSH: TSH: 1.9 u[IU]/mL (ref 0.35–5.50)

## 2024-03-06 ENCOUNTER — Other Ambulatory Visit: Payer: Self-pay

## 2024-03-06 MED ORDER — COMIRNATY 30 MCG/0.3ML IM SUSY
0.3000 mL | PREFILLED_SYRINGE | Freq: Once | INTRAMUSCULAR | 0 refills | Status: AC
  Filled 2024-03-06: qty 0.3, 1d supply, fill #0

## 2024-03-10 ENCOUNTER — Telehealth: Admitting: Family Medicine

## 2024-03-10 ENCOUNTER — Other Ambulatory Visit: Payer: Self-pay

## 2024-03-10 DIAGNOSIS — J019 Acute sinusitis, unspecified: Secondary | ICD-10-CM | POA: Diagnosis not present

## 2024-03-10 DIAGNOSIS — B9689 Other specified bacterial agents as the cause of diseases classified elsewhere: Secondary | ICD-10-CM

## 2024-03-10 MED ORDER — AMOXICILLIN-POT CLAVULANATE 875-125 MG PO TABS
1.0000 | ORAL_TABLET | Freq: Two times a day (BID) | ORAL | 0 refills | Status: AC
  Filled 2024-03-10: qty 14, 7d supply, fill #0

## 2024-03-10 NOTE — Progress Notes (Signed)
 E-Visit for Sinus Problems  We are sorry that you are not feeling well.  Here is how we plan to help!  Based on what you have shared with me it looks like you have sinusitis.  Sinusitis is inflammation and infection in the sinus cavities of the head.  Based on your presentation I believe you most likely have Acute Bacterial Sinusitis.  This is an infection caused by bacteria and is treated with antibiotics. I have prescribed Augmentin  875mg /125mg  one tablet twice daily with food, for 7 days. You may use an oral decongestant such as Mucinex D or if you have glaucoma or high blood pressure use plain Mucinex. Saline nasal spray help and can safely be used as often as needed for congestion.  If you develop worsening sinus pain, fever or notice severe headache and vision changes, or if symptoms are not better after completion of antibiotic, please schedule an appointment with a health care provider.    Sinus infections are not as easily transmitted as other respiratory infection, however we still recommend that you avoid close contact with loved ones, especially the very young and elderly.  Remember to wash your hands thoroughly throughout the day as this is the number one way to prevent the spread of infection!  Home Care: Only take medications as instructed by your medical team. Complete the entire course of an antibiotic. Do not take these medications with alcohol. A steam or ultrasonic humidifier can help congestion.  You can place a towel over your head and breathe in the steam from hot water coming from a faucet. Avoid close contacts especially the very young and the elderly. Cover your mouth when you cough or sneeze. Always remember to wash your hands.  Get Help Right Away If: You develop worsening fever or sinus pain. You develop a severe head ache or visual changes. Your symptoms persist after you have completed your treatment plan.  Make sure you Understand these instructions. Will  watch your condition. Will get help right away if you are not doing well or get worse.  Your e-visit answers were reviewed by a board certified advanced clinical practitioner to complete your personal care plan.  Depending on the condition, your plan could have included both over the counter or prescription medications.  If there is a problem please reply  once you have received a response from your provider.  Your safety is important to us .  If you have drug allergies check your prescription carefully.    You can use MyChart to ask questions about today's visit, request a non-urgent call back, or ask for a work or school excuse for 24 hours related to this e-Visit. If it has been greater than 24 hours you will need to follow up with your provider, or enter a new e-Visit to address those concerns.  You will get an e-mail in the next two days asking about your experience.  I hope that your e-visit has been valuable and will speed your recovery. Thank you for using e-visits.  I have spent 5 minutes in review of e-visit questionnaire, review and updating patient chart, medical decision making and response to patient.   Chiquita CHRISTELLA Barefoot, NP

## 2024-03-20 ENCOUNTER — Other Ambulatory Visit: Payer: Self-pay

## 2024-03-20 MED FILL — Zolpidem Tartrate Tab ER 12.5 MG: ORAL | 30 days supply | Qty: 30 | Fill #2 | Status: AC

## 2024-04-16 ENCOUNTER — Other Ambulatory Visit: Payer: Self-pay | Admitting: Family

## 2024-04-16 DIAGNOSIS — N951 Menopausal and female climacteric states: Secondary | ICD-10-CM

## 2024-04-16 DIAGNOSIS — G479 Sleep disorder, unspecified: Secondary | ICD-10-CM

## 2024-04-16 DIAGNOSIS — Z0000 Encounter for general adult medical examination without abnormal findings: Secondary | ICD-10-CM

## 2024-04-17 ENCOUNTER — Other Ambulatory Visit: Payer: Self-pay | Admitting: Family

## 2024-04-17 ENCOUNTER — Other Ambulatory Visit: Payer: Self-pay

## 2024-04-17 DIAGNOSIS — G479 Sleep disorder, unspecified: Secondary | ICD-10-CM

## 2024-04-17 DIAGNOSIS — N951 Menopausal and female climacteric states: Secondary | ICD-10-CM

## 2024-04-17 DIAGNOSIS — Z0000 Encounter for general adult medical examination without abnormal findings: Secondary | ICD-10-CM

## 2024-04-17 MED FILL — Hydroxyzine HCl Tab 10 MG: ORAL | 90 days supply | Qty: 180 | Fill #0 | Status: AC

## 2024-04-17 MED FILL — Zolpidem Tartrate Tab ER 12.5 MG: ORAL | 30 days supply | Qty: 30 | Fill #0 | Status: AC

## 2024-04-17 NOTE — Telephone Encounter (Signed)
 Refill request of Ambien  12.5mg  30 tab 0refills; last fill on 01/15/24; Last OV 02/26/24 for annual; Next OV 05/28/24 for follow up

## 2024-04-21 ENCOUNTER — Encounter: Payer: Self-pay | Admitting: Family

## 2024-04-21 NOTE — Telephone Encounter (Signed)
 Noted

## 2024-05-10 ENCOUNTER — Other Ambulatory Visit: Payer: Self-pay

## 2024-05-21 ENCOUNTER — Other Ambulatory Visit: Payer: Self-pay

## 2024-05-21 MED FILL — Zolpidem Tartrate Tab ER 12.5 MG: ORAL | 30 days supply | Qty: 30 | Fill #1 | Status: AC

## 2024-05-26 ENCOUNTER — Other Ambulatory Visit: Payer: Self-pay

## 2024-05-26 MED ORDER — TRAMADOL HCL 50 MG PO TABS
ORAL_TABLET | ORAL | 0 refills | Status: AC
  Filled 2024-05-26: qty 10, 5d supply, fill #0

## 2024-05-28 ENCOUNTER — Other Ambulatory Visit: Payer: Self-pay

## 2024-05-28 ENCOUNTER — Ambulatory Visit: Admitting: Family

## 2024-05-28 ENCOUNTER — Encounter: Payer: Self-pay | Admitting: Family

## 2024-05-28 VITALS — BP 122/62 | HR 88 | Temp 97.5°F | Ht 62.0 in | Wt 185.8 lb

## 2024-05-28 DIAGNOSIS — G479 Sleep disorder, unspecified: Secondary | ICD-10-CM

## 2024-05-28 DIAGNOSIS — N951 Menopausal and female climacteric states: Secondary | ICD-10-CM | POA: Diagnosis not present

## 2024-05-28 MED ORDER — ESZOPICLONE 1 MG PO TABS
1.0000 mg | ORAL_TABLET | Freq: Every day | ORAL | 0 refills | Status: AC
  Filled 2024-05-28: qty 30, 30d supply, fill #0

## 2024-05-28 NOTE — Assessment & Plan Note (Signed)
 Chronic , suboptimal control. Discontinue Ambien . Prescribe Lunesta  1 mg with self-titration every 3-5 days as needed, up to a maximum of 3 mg. Advise against alcohol use with Lunesta .  She will let me know how she is doing

## 2024-05-28 NOTE — Assessment & Plan Note (Signed)
 Chronic, significant improvement. Continue Paxil  20 mg daily

## 2024-05-28 NOTE — Patient Instructions (Signed)
 INcrease Lunesta  every 3-5 days if needed by 1mg ; MAX DOSE is 3mg . No higher than that!  Hold tramadol  and ambien  with Lunesta 

## 2024-05-28 NOTE — Progress Notes (Signed)
 "  Assessment & Plan:  Sleep disturbance Assessment & Plan: Chronic , suboptimal control. Discontinue Ambien . Prescribe Lunesta  1 mg with self-titration every 3-5 days as needed, up to a maximum of 3 mg. Advise against alcohol use with Lunesta .  She will let me know how she is doing  Orders: -     Eszopiclone ; Take 1 tablet (1 mg total) by mouth at bedtime. Take immediately before bedtime  Dispense: 30 tablet; Refill: 0  Vasomotor symptoms due to menopause Assessment & Plan: Chronic, significant improvement. Continue Paxil  20 mg daily      Return precautions given.   Risks, benefits, and alternatives of the medications and treatment plan prescribed today were discussed, and patient expressed understanding.   Education regarding symptom management and diagnosis given to patient on AVS either electronically or printed.  Return in about 3 months (around 08/26/2024).  Rollene Northern, FNP  Subjective:    Patient ID: Denise Gaines, female    DOB: 1976-05-04, 49 y.o.   MRN: [id number]  CC: Denise Gaines is a 49 y.o. female who presents today for follow up.   HPI: HPI Discussed the use of AI scribe software for clinical note transcription with the patient, who gave verbal consent to proceed.  History of Present Illness   Denise Gaines is a 49 year old female who presents for a follow-up regarding hot flashes and sleep disturbances.  Hot flashes have significantly improved with Paxil  20 mg daily. She is pleased with medication.  She experiences very occasional hot flashes which are not particularly bothersome.   Sleep disturbances persist despite nightly use of Ambien . Her sleep is inadequate, and missing a dose results in significant difficulty sleeping. She practices good sleep hygiene, including reading and relaxing before bed.        Dr Denise Gaines 05/26/24 for anal fissure  Rectal exam deferred by patient; pt with previous fissure  Reports no change in her hemorrhoids Prescribe  nifedpine/lidocaine  ointment for rectal fissure 10 tablets of tramadol  prescription x1 for pain               Allergies: Patient has no known allergies. Medications Ordered Prior to Encounter[1]  Review of Systems  Constitutional:  Negative for chills and fever.  Respiratory:  Negative for cough.   Cardiovascular:  Negative for chest pain and palpitations.  Gastrointestinal:  Negative for nausea and vomiting.  Psychiatric/Behavioral:  Positive for sleep disturbance. The patient is not nervous/anxious.       Objective:    BP 122/62   Pulse 88   Temp (!) 97.5 F (36.4 C) (Oral)   Ht 5' 2 (1.575 m)   Wt 185 lb 12.8 oz (84.3 kg)   SpO2 98%   BMI 33.98 kg/m  BP Readings from Last 3 Encounters:  05/28/24 122/62  02/26/24 122/76  08/01/23 120/72   Wt Readings from Last 3 Encounters:  05/28/24 185 lb 12.8 oz (84.3 kg)  02/26/24 202 lb 9.6 oz (91.9 kg)  08/01/23 211 lb 6.4 oz (95.9 kg)      05/28/2024    8:37 AM 02/26/2024    8:10 AM 12/19/2022    3:40 PM 12/15/2021    3:22 PM  GAD 7 : Generalized Anxiety Score  Nervous, Anxious, on Edge 0 0  1  0   Control/stop worrying 0 0  0  0   Worry too much - different things 0 0  0  0   Trouble relaxing 0 0  1  0  Restless 0 0  0  0   Easily annoyed or irritable 0 0  0  0   Afraid - awful might happen 0 0  0  0   Total GAD 7 Score 0 0 2 0  Anxiety Difficulty Not difficult at all Not difficult at all Not difficult at all Not difficult at all     Data saved with a previous flowsheet row definition       05/28/2024    8:37 AM 02/26/2024    8:09 AM 08/01/2023    4:27 PM  Depression screen PHQ 2/9  Decreased Interest 0 0 0  Down, Depressed, Hopeless 0 0 0  PHQ - 2 Score 0 0 0  Altered sleeping 1 0   Tired, decreased energy 0 0   Change in appetite 0 0   Feeling bad or failure about yourself  0 0   Trouble concentrating 0 0   Moving slowly or fidgety/restless 0 0   Suicidal thoughts 0 0   PHQ-9 Score 1 0     Difficult doing work/chores Not difficult at all Not difficult at all      Data saved with a previous flowsheet row definition     Physical Exam Vitals reviewed.  Constitutional:      Appearance: She is well-developed.  Eyes:     Conjunctiva/sclera: Conjunctivae normal.  Cardiovascular:     Rate and Rhythm: Normal rate and regular rhythm.     Pulses: Normal pulses.     Heart sounds: Normal heart sounds.  Pulmonary:     Effort: Pulmonary effort is normal.     Breath sounds: Normal breath sounds. No wheezing, rhonchi or rales.  Skin:    General: Skin is warm and dry.  Neurological:     Mental Status: She is alert.  Psychiatric:        Speech: Speech normal.        Behavior: Behavior normal.        Thought Content: Thought content normal.           [1]  Current Outpatient Medications on File Prior to Visit  Medication Sig Dispense Refill   buPROPion  (WELLBUTRIN  XL) 300 MG 24 hr tablet Take 1 tablet (300 mg total) by mouth daily. 90 tablet 3   hydrOXYzine  (ATARAX ) 10 MG tablet Take 1 tablet (10 mg total) by mouth 2 (two) times daily as needed for anxiety. 180 tablet 2   levonorgestrel  (MIRENA ) 20 MCG/24HR IUD 1 each by Intrauterine route once.     PARoxetine  (PAXIL ) 20 MG tablet Take 1 tablet (20 mg total) by mouth every morning. 90 tablet 3   polyethylene glycol (MIRALAX  / GLYCOLAX ) 17 g packet Take 17 g by mouth daily as needed. 14 each 1   traMADol  (ULTRAM ) 50 MG tablet Take 1 tablet (50 mg total) by mouth every 6 (six) hours as needed for Pain for up to 5 days 10 tablet 0   zolpidem  (AMBIEN  CR) 12.5 MG CR tablet Take 1 tablet (12.5 mg total) by mouth at bedtime as needed for sleep. 30 tablet 2   fluticasone  (FLONASE ) 50 MCG/ACT nasal spray Place 2 sprays into both nostrils daily. (Patient not taking: Reported on 05/28/2024) 16 g 6   triamcinolone  (KENALOG ) 0.025 % ointment Apply 1 Application topically 2 (two) times daily. Use sparingly < 1 week. (Patient not taking:  Reported on 05/28/2024) 30 g 1   No current facility-administered medications on file prior to visit.   "
